# Patient Record
Sex: Male | Born: 1985
Health system: Southern US, Community
[De-identification: ages and names within clinical notes are randomized; demographics above are authoritative.]

## PROBLEM LIST (undated history)

## (undated) DIAGNOSIS — E119 Type 2 diabetes mellitus without complications: Secondary | ICD-10-CM

## (undated) DIAGNOSIS — I10 Essential (primary) hypertension: Secondary | ICD-10-CM

## (undated) DIAGNOSIS — F419 Anxiety disorder, unspecified: Secondary | ICD-10-CM

## (undated) DIAGNOSIS — E669 Obesity, unspecified: Secondary | ICD-10-CM

## (undated) DIAGNOSIS — K219 Gastro-esophageal reflux disease without esophagitis: Secondary | ICD-10-CM

## (undated) DIAGNOSIS — G4730 Sleep apnea, unspecified: Secondary | ICD-10-CM

## (undated) HISTORY — DX: Obesity, unspecified: E66.9

## (undated) HISTORY — DX: Gastro-esophageal reflux disease without esophagitis: K21.9

## (undated) HISTORY — PX: OTHER SURGICAL HISTORY: SHX169

## (undated) HISTORY — DX: Anxiety disorder, unspecified: F41.9

## (undated) HISTORY — DX: Sleep apnea, unspecified: G47.30

## (undated) HISTORY — DX: Type 2 diabetes mellitus without complications: E11.9

## (undated) HISTORY — DX: Essential (primary) hypertension: I10

---

## 2018-06-21 ENCOUNTER — Ambulatory Visit: Payer: BLUE CROSS/BLUE SHIELD | Admitting: Family Medicine

## 2018-06-28 ENCOUNTER — Telehealth: Payer: Self-pay

## 2018-06-28 NOTE — Telephone Encounter (Signed)
Questions for Screening COVID-19  Symptom onset:n/a  Travel or Contacts: no  During this illness, did/does the patient experience any of the following symptoms? Fever >100.4F []  Yes [x]  No []  Unknown Subjective fever (felt feverish) []  Yes [x]  No []  Unknown Chills []  Yes [x]  No []  Unknown Muscle aches (myalgia) []  Yes [x]  No []  Unknown Runny nose (rhinorrhea) []  Yes [x]  No []  Unknown Sore throat []  Yes [x]  No []  Unknown Cough (new onset or worsening of chronic cough) []  Yes [x]  No []  Unknown Shortness of breath (dyspnea) []  Yes [x]  No []  Unknown Nausea or vomiting []  Yes [x]  No []  Unknown Headache []  Yes [x]  No []  Unknown Abdominal pain  []  Yes [x]  No []  Unknown Diarrhea (?3 loose/looser than normal stools/24hr period) []  Yes [x]  No []  Unknown Other, specify:  Patient risk factors: Smoker? []  Current []  Former []  Never If male, currently pregnant? []  Yes []  No  There are no active problems to display for this patient.   Plan:  []  High risk for COVID-19 with red flags go to ED (with CP, SOB, weak/lightheaded, or fever > 101.5). Call ahead.  []  High risk for COVID-19 but stable. Inform provider and coordinate time for WEBEX visit.   []  No red flags but URI signs or symptoms okay for WEBEX visit.  

## 2018-06-29 ENCOUNTER — Encounter: Payer: Self-pay | Admitting: Family Medicine

## 2018-06-29 ENCOUNTER — Other Ambulatory Visit: Payer: Self-pay

## 2018-06-29 ENCOUNTER — Ambulatory Visit (INDEPENDENT_AMBULATORY_CARE_PROVIDER_SITE_OTHER): Payer: BC Managed Care – PPO | Admitting: Family Medicine

## 2018-06-29 VITALS — BP 140/92 | HR 65 | Temp 97.6°F | Ht 71.0 in | Wt 375.8 lb

## 2018-06-29 DIAGNOSIS — Z7689 Persons encountering health services in other specified circumstances: Secondary | ICD-10-CM | POA: Diagnosis not present

## 2018-06-29 DIAGNOSIS — Z6841 Body Mass Index (BMI) 40.0 and over, adult: Secondary | ICD-10-CM

## 2018-06-29 DIAGNOSIS — I10 Essential (primary) hypertension: Secondary | ICD-10-CM

## 2018-06-29 DIAGNOSIS — Z0000 Encounter for general adult medical examination without abnormal findings: Secondary | ICD-10-CM | POA: Diagnosis not present

## 2018-06-29 DIAGNOSIS — H18603 Keratoconus, unspecified, bilateral: Secondary | ICD-10-CM | POA: Diagnosis not present

## 2018-06-29 LAB — LIPID PANEL
Cholesterol: 133 mg/dL (ref 0–200)
HDL: 27.6 mg/dL — ABNORMAL LOW (ref >39.00)
LDL Cholesterol: 78 mg/dL (ref 0–99)
NonHDL: 105.88
Total CHOL/HDL Ratio: 5
Triglycerides: 140 mg/dL (ref 0.0–149.0)
VLDL: 28 mg/dL (ref 0.0–40.0)

## 2018-06-29 LAB — BASIC METABOLIC PANEL
BUN: 11 mg/dL (ref 6–23)
CO2: 29 mEq/L (ref 19–32)
Calcium: 9.1 mg/dL (ref 8.4–10.5)
Chloride: 102 mEq/L (ref 96–112)
Creatinine, Ser: 1.03 mg/dL (ref 0.40–1.50)
GFR: 101.28 mL/min (ref >60.00)
Glucose, Bld: 118 mg/dL — ABNORMAL HIGH (ref 70–99)
Potassium: 4 mEq/L (ref 3.5–5.1)
Sodium: 138 mEq/L (ref 135–145)

## 2018-06-29 LAB — ALT: ALT: 66 U/L — ABNORMAL HIGH (ref 0–53)

## 2018-06-29 LAB — AST: AST: 40 U/L — ABNORMAL HIGH (ref 0–37)

## 2018-06-29 MED ORDER — CARVEDILOL 12.5 MG PO TABS: 12.5000 mg | ORAL_TABLET | Freq: Two times a day (BID) | ORAL | 3 refills | Status: DC

## 2018-06-29 NOTE — Progress Notes (Signed)
Raymond Costa is a 33 y.o. male  Chief Complaint  Patient presents with  . Establish Care    CPE/ Fasting/ Tdap Tdap 2016    HPI: Raymond Costa is a 33 y.o. male here to establish care with our office. He is here for CPE, fasting labs. He is married, no children.  Previous PCP Dr. Morton Stall in [place]. Last OV 1 year ago. Last Tadp 2016.  Pt has a h/o HTN and has been on coreg 12.5mg  BID x 2 years. He notes being on lisinopril prior to coreg but pt asked to switch d/t "side effects he heard about"  Dental exam: 1 year ago, due Vision: UTD - last appt earlier today  Diet/Exercise: pt has started doing intermittent fasting (12am-4pm); pt exercises 30 min daily  Med refills needed today: none  Past Medical History:  Diagnosis Date  . Hypertension     History reviewed. No pertinent surgical history.  Social History   Socioeconomic History  . Marital status: Married    Spouse name: Not on file  . Number of children: Not on file  . Years of education: Not on file  . Highest education level: Not on file  Occupational History  . Not on file  Social Needs  . Financial resource strain: Not on file  . Food insecurity    Worry: Not on file    Inability: Not on file  . Transportation needs    Medical: Not on file    Non-medical: Not on file  Tobacco Use  . Smoking status: Never Smoker  . Smokeless tobacco: Never Used  Substance and Sexual Activity  . Alcohol use: Never    Frequency: Never  . Drug use: Never  . Sexual activity: Not on file  Lifestyle  . Physical activity    Days per week: Not on file    Minutes per session: Not on file  . Stress: Not on file  Relationships  . Social Herbalist on phone: Not on file    Gets together: Not on file    Attends religious service: Not on file    Active member of club or organization: Not on file    Attends meetings of clubs or organizations: Not on file    Relationship status: Not on file  . Intimate partner  violence    Fear of current or ex partner: Not on file    Emotionally abused: Not on file    Physically abused: Not on file    Forced sexual activity: Not on file  Other Topics Concern  . Not on file  Social History Narrative  . Not on file    Family History  Problem Relation Age of Onset  . Hypertension Mother   . Diabetes Mother   . Cancer Father        leukemia      There is no immunization history on file for this patient.  Outpatient Encounter Medications as of 06/29/2018  Medication Sig  . carvedilol (COREG) 12.5 MG tablet Take 12.5 mg by mouth 2 (two) times daily with a meal.   No facility-administered encounter medications on file as of 06/29/2018.      ROS: Gen: no fever, chills  Skin: no rash, itching ENT: no ear pain, ear drainage, nasal congestion, rhinorrhea, sinus pressure, sore throat Eyes: no blurry vision, double vision Resp: no cough, wheeze,SOB CV: no CP, palpitations, LE edema,  GI: occasional heartburn (mostly depending on type of food he  eats), n/v/d/c, abd pain GU: no dysuria, urgency, frequency, hematuria; no testicular swelling or masses MSK: no joint pain, myalgias, back pain Neuro: no dizziness, headache, weakness, vertigo Psych: no depression, anxiety, insomnia   No Known Allergies  BP (!) 140/92   Pulse 65   Temp 97.6 F (36.4 C) (Oral)   Ht 5\' 11"  (1.803 m)   Wt (!) 375 lb 12.8 oz (170.5 kg)   SpO2 97%   BMI 52.41 kg/m   BP Readings from Last 3 Encounters:  06/29/18 (!) 140/92     Physical Exam  Constitutional: He is oriented to person, place, and time. He appears well-developed and well-nourished. No distress.  Morbidly obese  HENT:  Head: Normocephalic and atraumatic.  Right Ear: Tympanic membrane and ear canal normal.  Left Ear: Tympanic membrane and ear canal normal.  Nose: Nose normal.  Mouth/Throat: Oropharynx is clear and moist and mucous membranes are normal.  Eyes: Pupils are equal, round, and reactive to light.  Conjunctivae are normal.  Neck: Normal range of motion. No thyromegaly present.  Cardiovascular: Normal rate and regular rhythm. Exam reveals distant heart sounds.  No murmur heard. Pulmonary/Chest: Effort normal and breath sounds normal. No respiratory distress.  Abdominal: Soft. Bowel sounds are normal. He exhibits no distension. There is no abdominal tenderness.  Musculoskeletal:        General: No edema.  Lymphadenopathy:    He has no cervical adenopathy.  Neurological: He is alert and oriented to person, place, and time. He exhibits normal muscle tone. Coordination normal.  Skin: Skin is warm and dry.  Psychiatric: He has a normal mood and affect. His behavior is normal.     A/P:  1. Annual physical exam - immunizations UTD - due for dental exam, UTD on vision exam - diet and exercise - see below #3 - ALT - AST - Basic metabolic panel - Lipid panel - next CPE in 1 year  2. Encounter to establish care with new doctor  3. BMI 50.0-59.9, adult (HCC) - pt has recently started intermittent fasting (12am-4pm) and notes that his clothes fit better/feel loose but he hasn't seen a decrease in his weight yet - recommended pt continue with IF and start regular walking/exercise routine with goal of 30-3745min 4-5x/wk  4. Essential hypertension - BP slightly elevated today but pt states BP has been <140/<90 almost consistently since starting coreg - recommend DASH diet Refill: - carvedilol (COREG) 12.5 MG tablet; Take 1 tablet (12.5 mg total) by mouth 2 (two) times daily with a meal.  Dispense: 180 tablet; Refill: 3

## 2018-06-30 ENCOUNTER — Other Ambulatory Visit: Payer: Self-pay | Admitting: Family Medicine

## 2018-06-30 ENCOUNTER — Encounter: Payer: Self-pay | Admitting: Family Medicine

## 2018-06-30 DIAGNOSIS — R7301 Impaired fasting glucose: Secondary | ICD-10-CM

## 2018-07-26 ENCOUNTER — Other Ambulatory Visit: Payer: Self-pay | Admitting: *Deleted

## 2018-07-26 DIAGNOSIS — R6889 Other general symptoms and signs: Secondary | ICD-10-CM | POA: Diagnosis not present

## 2018-07-26 DIAGNOSIS — Z20822 Contact with and (suspected) exposure to covid-19: Secondary | ICD-10-CM

## 2018-07-30 LAB — NOVEL CORONAVIRUS, NAA: SARS-CoV-2, NAA: NOT DETECTED

## 2018-08-07 ENCOUNTER — Telehealth: Payer: Self-pay

## 2018-08-07 NOTE — Telephone Encounter (Signed)
Copied from [place] [id number]. Topic: General - Other >> Aug 07, 2018 11:19 AM Yvette Rack wrote: Reason for CRM: Pt stated he needs a copy of his Covid-19 test results to return back to work. Informed pt that he should be able to see the results on his [employer] account. Pt asked if he could have a copy of the results to provide to his employer. Pt requests call back once copy of results is available for pick up

## 2018-08-07 NOTE — Telephone Encounter (Signed)
Letter printed, placed up front for the pt to pick up before 4 pm. Pt is aware.

## 2018-08-30 ENCOUNTER — Emergency Department (HOSPITAL_COMMUNITY)
Admission: EM | Admit: 2018-08-30 | Discharge: 2018-08-30 | Disposition: A | Discharge status: Home / Self Care | Payer: BC Managed Care – PPO | Attending: Emergency Medicine | Admitting: Emergency Medicine

## 2018-08-30 ENCOUNTER — Emergency Department (HOSPITAL_COMMUNITY): Payer: BC Managed Care – PPO

## 2018-08-30 ENCOUNTER — Other Ambulatory Visit: Payer: Self-pay

## 2018-08-30 ENCOUNTER — Encounter (HOSPITAL_COMMUNITY): Payer: Self-pay

## 2018-08-30 DIAGNOSIS — R0789 Other chest pain: Secondary | ICD-10-CM | POA: Diagnosis not present

## 2018-08-30 DIAGNOSIS — Z79899 Other long term (current) drug therapy: Secondary | ICD-10-CM | POA: Insufficient documentation

## 2018-08-30 DIAGNOSIS — R002 Palpitations: Secondary | ICD-10-CM

## 2018-08-30 LAB — BASIC METABOLIC PANEL
Anion gap: 10 (ref 5–15)
BUN: 10 mg/dL (ref 6–20)
CO2: 25 mmol/L (ref 22–32)
Calcium: 9.1 mg/dL (ref 8.9–10.3)
Chloride: 104 mmol/L (ref 98–111)
Creatinine, Ser: 1.1 mg/dL (ref 0.61–1.24)
GFR calc Af Amer: >60 mL/min (ref >60)
GFR calc non Af Amer: >60 mL/min (ref >60)
Glucose, Bld: 101 mg/dL — ABNORMAL HIGH (ref 70–99)
Potassium: 4 mmol/L (ref 3.5–5.1)
Sodium: 139 mmol/L (ref 135–145)

## 2018-08-30 LAB — CBC
HCT: 44.7 % (ref 39.0–52.0)
Hemoglobin: 16.3 g/dL (ref 13.0–17.0)
MCH: 29.4 pg (ref 26.0–34.0)
MCHC: 36.5 g/dL — ABNORMAL HIGH (ref 30.0–36.0)
MCV: 80.7 fL (ref 80.0–100.0)
Platelets: 317 10*3/uL (ref 150–400)
RBC: 5.54 MIL/uL (ref 4.22–5.81)
RDW: 13.5 % (ref 11.5–15.5)
WBC: 6 10*3/uL (ref 4.0–10.5)
nRBC: 0 % (ref 0.0–0.2)

## 2018-08-30 LAB — TSH: TSH: 2.109 u[IU]/mL (ref 0.350–4.500)

## 2018-08-30 LAB — D-DIMER, QUANTITATIVE: D-Dimer, Quant: <0.27 ug/mL-FEU (ref 0.00–0.50)

## 2018-08-30 LAB — TROPONIN I (HIGH SENSITIVITY)
Troponin I (High Sensitivity): 5 ng/L (ref <18)
Troponin I (High Sensitivity): 4 ng/L (ref <18)

## 2018-08-30 MED ORDER — SODIUM CHLORIDE 0.9% FLUSH: 3.0000 mL | Freq: Once | INTRAVENOUS | Status: DC

## 2018-08-30 MED ORDER — LORAZEPAM 2 MG/ML IJ SOLN
0.5000 mg | Freq: Once | INTRAMUSCULAR | Status: AC
  Administered 2018-08-30: 16:00:00 0.5 mg via INTRAVENOUS
  Filled 2018-08-30: qty 1

## 2018-08-30 MED ORDER — LABETALOL HCL 5 MG/ML IV SOLN
10.0000 mg | Freq: Once | INTRAVENOUS | Status: AC
  Administered 2018-08-30: 16:00:00 10 mg via INTRAVENOUS
  Filled 2018-08-30: qty 4

## 2018-08-30 NOTE — ED Triage Notes (Signed)
Patient reports that he was heading to work and felt a flutter in his right upper chest and thought it could be reflux,but once at work he continues to have fluttering and felt like it had increased. Patient states he did "burp" some,but that did not relieve the symptoms. Patient states when the symptoms started he "felt winded"

## 2018-08-30 NOTE — Discharge Instructions (Addendum)
Avoid energy drinks and caffeine.  Follow-up closely with your primary physician regarding blood pressure control.  Return immediately for any worsening of your symptoms or concerns.

## 2018-08-30 NOTE — ED Provider Notes (Signed)
[facility]-EMERGENCY DEPT Provider Note   CSN: [id number] Arrival date & time: 08/30/18  1309    History   Chief Complaint Chief Complaint  Patient presents with  . fluttering in chest  . Hypertension    HPI Raymond Costa is a 33 y.o. male.     HPI Patient with history of hypertension for which he takes carvedilol twice daily.  States he has been compliant with the medication.  While at work this morning he developed palpitations.  He became increasingly anxious and had left-sided chest pain which is now resolved.  Patient still feels anxious.  He denies any new lower extremity swelling or pain.  No recent extended travel or immobilization.  Patient does admit to drinking caffeinated drinks frequently. Past Medical History:  Diagnosis Date  . Hypertension     There are no active problems to display for this patient.   Past Surgical History:  Procedure Laterality Date  . arm fracture surgery          Home Medications    Prior to Admission medications   Medication Sig Start Date End Date Taking? Authorizing Provider  carvedilol (COREG) 12.5 MG tablet Take 1 tablet (12.5 mg total) by mouth 2 (two) times daily with a meal. 06/29/18  Yes Cirigliano, Jearld LeschMary K, DO    Family History Family History  Problem Relation Age of Onset  . Hypertension Mother   . Diabetes Mother   . Cancer Father        leukemia    Social History Social History   Tobacco Use  . Smoking status: Never Smoker  . Smokeless tobacco: Never Used  Substance Use Topics  . Alcohol use: Never    Frequency: Never  . Drug use: Never     Allergies   Shellfish allergy   Review of Systems Review of Systems  Constitutional: Negative for chills and fever.  HENT: Negative for sore throat and trouble swallowing.   Eyes: Negative for visual disturbance.  Respiratory: Positive for shortness of breath. Negative for cough.   Cardiovascular: Positive for chest pain and  palpitations. Negative for leg swelling.  Gastrointestinal: Negative for abdominal pain, constipation, diarrhea, nausea and vomiting.  Genitourinary: Negative for dysuria, flank pain and frequency.  Musculoskeletal: Negative for back pain, joint swelling, myalgias and neck pain.  Skin: Negative for rash and wound.  Neurological: Negative for dizziness, syncope, weakness, light-headedness, numbness and headaches.  Psychiatric/Behavioral: The patient is nervous/anxious.   All other systems reviewed and are negative.    Physical Exam Updated Vital Signs BP (!) 185/117   Pulse 100   Temp 99.2 F (37.3 C) (Oral)   Resp 16   Ht 6' (1.829 m)   Wt (!) 166.9 kg   SpO2 97%   BMI 49.91 kg/m   Physical Exam Vitals signs and nursing note reviewed.  Constitutional:      Appearance: He is well-developed. He is obese.     Comments: Anxious appearing  HENT:     Head: Normocephalic and atraumatic.     Nose: Nose normal.     Mouth/Throat:     Mouth: Mucous membranes are moist.  Eyes:     Extraocular Movements: Extraocular movements intact.     Pupils: Pupils are equal, round, and reactive to light.  Neck:     Musculoskeletal: Normal range of motion and neck supple. No neck rigidity or muscular tenderness.     Comments: No appreciated thyroid masses. Cardiovascular:     Rate  and Rhythm: Regular rhythm. Tachycardia present.     Heart sounds: No murmur. No friction rub. No gallop.   Pulmonary:     Effort: Pulmonary effort is normal. No respiratory distress.     Breath sounds: Normal breath sounds. No stridor. No wheezing, rhonchi or rales.  Chest:     Chest wall: No tenderness.  Abdominal:     General: Bowel sounds are normal. There is no distension.     Palpations: Abdomen is soft. There is no mass.     Tenderness: There is no abdominal tenderness. There is no right CVA tenderness, left CVA tenderness, guarding or rebound.     Hernia: No hernia is present.  Musculoskeletal: Normal  range of motion.        General: No swelling, tenderness, deformity or signs of injury.     Right lower leg: No edema.     Left lower leg: No edema.  Lymphadenopathy:     Cervical: No cervical adenopathy.  Skin:    General: Skin is warm and dry.     Capillary Refill: Capillary refill takes less than 2 seconds.     Findings: No erythema or rash.  Neurological:     General: No focal deficit present.     Mental Status: He is alert and oriented to person, place, and time.     Comments: 5/5 motor in all extremities.  Sensation intact.  No tremor present.  Psychiatric:        Behavior: Behavior normal.      ED Treatments / Results  Labs (all labs ordered are listed, but only abnormal results are displayed) Labs Reviewed  BASIC METABOLIC PANEL - Abnormal; Notable for the following components:      Result Value   Glucose, Bld 101 (*)    All other components within normal limits  CBC - Abnormal; Notable for the following components:   MCHC 36.5 (*)    All other components within normal limits  TSH  D-DIMER, QUANTITATIVE (NOT AT [facility])  TROPONIN I (HIGH SENSITIVITY)  TROPONIN I (HIGH SENSITIVITY)  TROPONIN I (HIGH SENSITIVITY)    EKG EKG Interpretation  Date/Time:  Wednesday August 30 2018 13:22:34 EDT Ventricular Rate:  119 PR Interval:    QRS Duration: 82 QT Interval:  332 QTC Calculation: 468 R Axis:   -14 Text Interpretation:  Sinus tachycardia Confirmed by Benjiman CorePickering, Nathan [phone]([id number]) on 08/30/2018 1:48:18 PM   Radiology Dg Chest 2 View  Result Date: 08/30/2018 CLINICAL DATA:  Heart fluttering this morning; no chest hx EXAM: CHEST - 2 VIEW COMPARISON:  None. FINDINGS: The heart size and mediastinal contours are within normal limits. The lungs are clear. No pneumothorax or pleural effusion. The visualized skeletal structures are unremarkable. IMPRESSION: No acute cardiopulmonary process Electronically Signed   By: Emmaline KluverNancy  Ballantyne M.D.   On: 08/30/2018 13:50     Procedures Procedures (including critical care time)  Medications Ordered in ED Medications  LORazepam (ATIVAN) injection 0.5 mg (0.5 mg Intravenous Given 08/30/18 1536)  labetalol (NORMODYNE) injection 10 mg (10 mg Intravenous Given 08/30/18 1626)     Initial Impression / Assessment and Plan / ED Course  I have reviewed the triage vital signs and the nursing notes.  Pertinent labs & imaging results that were available during my care of the patient were reviewed by me and considered in my medical decision making (see chart for details).       Chest pain and palpitations have completely resolved.  Troponin x2 is  normal.  Normal d-dimer.  Patient did receive dose of labetalol in the emergency department for his hypertension with improvement.  He will follow-up closely with his primary physician regarding possible adjustment to his blood pressure medication.  Anxiety is likely playing a role in his symptoms as well.  Strict return precautions have been given.   Final Clinical Impressions(s) / ED Diagnoses   Final diagnoses:  Palpitations  Atypical chest pain    ED Discharge Orders    None       Julianne Rice, MD 08/30/18 1918

## 2018-08-31 ENCOUNTER — Ambulatory Visit (INDEPENDENT_AMBULATORY_CARE_PROVIDER_SITE_OTHER): Payer: BC Managed Care – PPO | Admitting: Family Medicine

## 2018-08-31 ENCOUNTER — Encounter: Payer: Self-pay | Admitting: Gastroenterology

## 2018-08-31 ENCOUNTER — Encounter: Payer: Self-pay | Admitting: Family Medicine

## 2018-08-31 ENCOUNTER — Other Ambulatory Visit: Payer: Self-pay

## 2018-08-31 VITALS — BP 148/92 | HR 79 | Temp 98.1°F | Ht 71.0 in | Wt 368.6 lb

## 2018-08-31 DIAGNOSIS — Z6841 Body Mass Index (BMI) 40.0 and over, adult: Secondary | ICD-10-CM

## 2018-08-31 DIAGNOSIS — K219 Gastro-esophageal reflux disease without esophagitis: Secondary | ICD-10-CM | POA: Diagnosis not present

## 2018-08-31 DIAGNOSIS — I10 Essential (primary) hypertension: Secondary | ICD-10-CM

## 2018-08-31 MED ORDER — LOSARTAN POTASSIUM-HCTZ 50-12.5 MG PO TABS: 1.0000 | ORAL_TABLET | Freq: Every day | ORAL | 3 refills | Status: DC

## 2018-08-31 NOTE — Progress Notes (Addendum)
Raymond Costa is a 33 y.o. male  Chief Complaint  Patient presents with  . Establish Care    ED follow up    HPI: Raymond Costa is a 33 y.o. male here for f/u after being seen in ER at [facility] yesterday for palpitations and chest pain, both of which have resolved at this time. In ER, pt has neg troponin x 2, neg d-dimer, normal TSH, CBC, BMP. EKG showed sinus tach but otherwise normal.  BP was elevated and improved with labetalol 10mg  IV x 1 dose. Pt was also given 0.5mg  ativan IV x 1 due to increased anxiety.   Today, pt is asymptomatic. He is worried about BP.   Pt has HTN and takes coreg 12.5mg  BID. He was on HCTZ as well in the past. It ran out and pt did not refill. He was concerned about possible side effects so PCP changed him to coreg.     Pt also notes GERD x years. Some foods are known triggers but still gets symptoms when he avoids these. He has tried nexium, maalox with minimal relief. He belches frequently. He sometimes regurgitates food. He would like to see GI.   Past Medical History:  Diagnosis Date  . Hypertension     Past Surgical History:  Procedure Laterality Date  . arm fracture surgery      Social History   Socioeconomic History  . Marital status: Married    Spouse name: Not on file  . Number of children: Not on file  . Years of education: Not on file  . Highest education level: Not on file  Occupational History  . Not on file  Social Needs  . Financial resource strain: Not on file  . Food insecurity    Worry: Not on file    Inability: Not on file  . Transportation needs    Medical: Not on file    Non-medical: Not on file  Tobacco Use  . Smoking status: Never Smoker  . Smokeless tobacco: Never Used  Substance and Sexual Activity  . Alcohol use: Never    Frequency: Never  . Drug use: Never  . Sexual activity: Not on file  Lifestyle  . Physical activity    Days per week: Not on file    Minutes per session: Not on file  .  Stress: Not on file  Relationships  . Social Herbalist on phone: Not on file    Gets together: Not on file    Attends religious service: Not on file    Active member of club or organization: Not on file    Attends meetings of clubs or organizations: Not on file    Relationship status: Not on file  . Intimate partner violence    Fear of current or ex partner: Not on file    Emotionally abused: Not on file    Physically abused: Not on file    Forced sexual activity: Not on file  Other Topics Concern  . Not on file  Social History Narrative  . Not on file    Family History  Problem Relation Age of Onset  . Hypertension Mother   . Diabetes Mother   . Cancer Father        leukemia      There is no immunization history on file for this patient.  Outpatient Encounter Medications as of 08/31/2018  Medication Sig Note  . carvedilol (COREG) 12.5 MG tablet Take 1 tablet (12.5  mg total) by mouth 2 (two) times daily with a meal. 08/30/2018: Pt takes at 0900 and at 21:00.  . losartan-hydrochlorothiazide (HYZAAR) 50-12.5 MG tablet Take 1 tablet by mouth daily.    No facility-administered encounter medications on file as of 08/31/2018.      ROS: Pertinent positives and negatives noted in HPI. Remainder of ROS non-contributory    Allergies  Allergen Reactions  . Shellfish Allergy Hives    BP (!) 148/92   Pulse 79   Temp 98.1 F (36.7 C) (Oral)   Ht 5\' 11"  (1.803 m)   Wt (!) 368 lb 9.6 oz (167.2 kg)   SpO2 97%   BMI 51.41 kg/m   BP Readings from Last 3 Encounters:  08/31/18 (!) 148/92  08/30/18 (!) 185/117  06/29/18 (!) 140/92   Wt Readings from Last 3 Encounters:  08/31/18 (!) 368 lb 9.6 oz (167.2 kg)  08/30/18 (!) 368 lb (166.9 kg)  06/29/18 (!) 375 lb 12.8 oz (170.5 kg)    Physical Exam  Constitutional: He is oriented to person, place, and time. He appears well-developed and well-nourished. No distress.  Neck: No thyromegaly present.  Cardiovascular:  Normal rate, regular rhythm, S1 normal, S2 normal and normal pulses. Exam reveals distant heart sounds.  Pulmonary/Chest: Effort normal. He has no decreased breath sounds. He has no wheezes. He has no rhonchi.  Musculoskeletal:        General: No edema.  Neurological: He is alert and oriented to person, place, and time.  Skin: Skin is warm and dry.     A/P:  1. Essential hypertension - BP above goal - stop coreg  Rx: - losartan-hydrochlorothiazide (HYZAAR) 50-12.5 MG tablet; Take 1 tablet by mouth daily.  Dispense: 90 tablet; Refill: 3 - pt to check to BP at home 4x/wk x 2 wks then f/u in 2 wks - cont with low salt diet - congratulated pt on weight loss since last OV Discussed plan and reviewed medications with patient, including risks, benefits, and potential side effects. Pt expressed understand. All questions answered.

## 2018-08-31 NOTE — Patient Instructions (Addendum)
Stop coreg Start losartan/HCTZ 1 tab daily in AM Check BP about 4x/wk at least 2hrs after taking med and keep a log of these readings Follow-up in 2 weeks    Managing Your Hypertension Hypertension is commonly called high blood pressure. This is when the force of your blood pressing against the walls of your arteries is too strong. Arteries are blood vessels that carry blood from your heart throughout your body. Hypertension forces the heart to work harder to pump blood, and may cause the arteries to become narrow or stiff. Having untreated or uncontrolled hypertension can cause heart attack, stroke, kidney disease, and other problems. What are blood pressure readings? A blood pressure reading consists of a higher number over a lower number. Ideally, your blood pressure should be below 120/80. The first ("top") number is called the systolic pressure. It is a measure of the pressure in your arteries as your heart beats. The second ("bottom") number is called the diastolic pressure. It is a measure of the pressure in your arteries as the heart relaxes. What does my blood pressure reading mean? Blood pressure is classified into four stages. Based on your blood pressure reading, your health care provider may use the following stages to determine what type of treatment you need, if any. Systolic pressure and diastolic pressure are measured in a unit called mm Hg. Normal  Systolic pressure: below 120.  Diastolic pressure: below 80. Elevated  Systolic pressure: 120-129.  Diastolic pressure: below 80. Hypertension stage 1  Systolic pressure: 130-139.  Diastolic pressure: 80-89. Hypertension stage 2  Systolic pressure: 140 or above.  Diastolic pressure: 90 or above. What health risks are associated with hypertension? Managing your hypertension is an important responsibility. Uncontrolled hypertension can lead to:  A heart attack.  A stroke.  A weakened blood vessel (aneurysm).  Heart  failure.  Kidney damage.  Eye damage.  Metabolic syndrome.  Memory and concentration problems. What changes can I make to manage my hypertension? Hypertension can be managed by making lifestyle changes and possibly by taking medicines. Your health care provider will help you make a plan to bring your blood pressure within a normal range. Eating and drinking   Eat a diet that is high in fiber and potassium, and low in salt (sodium), added sugar, and fat. An example eating plan is called the DASH (Dietary Approaches to Stop Hypertension) diet. To eat this way: ? Eat plenty of fresh fruits and vegetables. Try to fill half of your plate at each meal with fruits and vegetables. ? Eat whole grains, such as whole wheat pasta, brown rice, or whole grain bread. Fill about one quarter of your plate with whole grains. ? Eat low-fat diary products. ? Avoid fatty cuts of meat, processed or cured meats, and poultry with skin. Fill about one quarter of your plate with lean proteins such as fish, chicken without skin, beans, eggs, and tofu. ? Avoid premade and processed foods. These tend to be higher in sodium, added sugar, and fat.  Reduce your daily sodium intake. Most people with hypertension should eat less than 1,500 mg of sodium a day.  Limit alcohol intake to no more than 1 drink a day for nonpregnant women and 2 drinks a day for men. One drink equals 12 oz of beer, 5 oz of wine, or 1 oz of hard liquor. Lifestyle  Work with your health care provider to maintain a healthy body weight, or to lose weight. Ask what an ideal weight is for  you.  Get at least 30 minutes of exercise that causes your heart to beat faster (aerobic exercise) most days of the week. Activities may include walking, swimming, or biking.  Include exercise to strengthen your muscles (resistance exercise), such as weight lifting, as part of your weekly exercise routine. Try to do these types of exercises for 30 minutes at least  3 days a week.  Do not use any products that contain nicotine or tobacco, such as cigarettes and e-cigarettes. If you need help quitting, ask your health care provider.  Control any long-term (chronic) conditions you have, such as high cholesterol or diabetes. Monitoring  Monitor your blood pressure at home as told by your health care provider. Your personal target blood pressure may vary depending on your medical conditions, your age, and other factors.  Have your blood pressure checked regularly, as often as told by your health care provider. Working with your health care provider  Review all the medicines you take with your health care provider because there may be side effects or interactions.  Talk with your health care provider about your diet, exercise habits, and other lifestyle factors that may be contributing to hypertension.  Visit your health care provider regularly. Your health care provider can help you create and adjust your plan for managing hypertension. Will I need medicine to control my blood pressure? Your health care provider may prescribe medicine if lifestyle changes are not enough to get your blood pressure under control, and if:  Your systolic blood pressure is 130 or higher.  Your diastolic blood pressure is 80 or higher. Take medicines only as told by your health care provider. Follow the directions carefully. Blood pressure medicines must be taken as prescribed. The medicine does not work as well when you skip doses. Skipping doses also puts you at risk for problems. Contact a health care provider if:  You think you are having a reaction to medicines you have taken.  You have repeated (recurrent) headaches.  You feel dizzy.  You have swelling in your ankles.  You have trouble with your vision. Get help right away if:  You develop a severe headache or confusion.  You have unusual weakness or numbness, or you feel faint.  You have severe pain in your  chest or abdomen.  You vomit repeatedly.  You have trouble breathing. Summary  Hypertension is when the force of blood pumping through your arteries is too strong. If this condition is not controlled, it may put you at risk for serious complications.  Your personal target blood pressure may vary depending on your medical conditions, your age, and other factors. For most people, a normal blood pressure is less than 120/80.  Hypertension is managed by lifestyle changes, medicines, or both. Lifestyle changes include weight loss, eating a healthy, low-sodium diet, exercising more, and limiting alcohol. This information is not intended to replace advice given to you by your health care provider. Make sure you discuss any questions you have with your health care provider. Document Released: 09/22/2011 Document Revised: 04/21/2018 Document Reviewed: 11/26/2015 Elsevier Patient Education  2020 [employer].

## 2018-08-31 NOTE — Addendum Note (Signed)
Addended by: Ronnald Nian on: 08/31/2018 02:55 PM   Modules accepted: Orders

## 2018-09-11 ENCOUNTER — Ambulatory Visit (INDEPENDENT_AMBULATORY_CARE_PROVIDER_SITE_OTHER): Payer: BC Managed Care – PPO | Admitting: Gastroenterology

## 2018-09-11 ENCOUNTER — Other Ambulatory Visit: Payer: Self-pay

## 2018-09-11 ENCOUNTER — Encounter: Payer: Self-pay | Admitting: Gastroenterology

## 2018-09-11 VITALS — BP 134/94 | HR 91 | Temp 97.6°F | Ht 71.0 in | Wt 367.2 lb

## 2018-09-11 DIAGNOSIS — R1310 Dysphagia, unspecified: Secondary | ICD-10-CM | POA: Diagnosis not present

## 2018-09-11 DIAGNOSIS — Z6841 Body Mass Index (BMI) 40.0 and over, adult: Secondary | ICD-10-CM | POA: Diagnosis not present

## 2018-09-11 DIAGNOSIS — K219 Gastro-esophageal reflux disease without esophagitis: Secondary | ICD-10-CM | POA: Diagnosis not present

## 2018-09-11 DIAGNOSIS — R1319 Other dysphagia: Secondary | ICD-10-CM

## 2018-09-11 MED ORDER — PANTOPRAZOLE SODIUM 40 MG PO TBEC: 40.0000 mg | DELAYED_RELEASE_TABLET | Freq: Two times a day (BID) | ORAL | 0 refills | Status: DC

## 2018-09-11 NOTE — Patient Instructions (Addendum)
If you are age 32 or older, your body mass index should be between 23-30. Your Body mass index is 51.22 kg/m. If this is out of the aforementioned range listed, please consider follow up with your Primary Care Provider.  If you are age 15 or younger, your body mass index should be between 19-25. Your Body mass index is 51.22 kg/m. If this is out of the aformentioned range listed, please consider follow up with your Primary Care Provider.   It has been recommended to you by your physician that you have a(n) EGD completed. We did not schedule the procedure(s) today. You will be contacted by our office to have this scheduled once there is available appointment.   We have sent the following medications to your pharmacy for you to pick up at your convenience: Protonix 40 mg twice daily x 4 weeks and then once daily.   Follow up in 3 months.   It was a pleasure to see you today!  Vito Cirigliano, D.O.

## 2018-09-11 NOTE — Progress Notes (Signed)
Chief Complaint: GERD, dysphagia  Referring Provider:     Ronnald Nian, DO  HPI:    Raymond Costa is a 33 y.o. male with a history of hypertension, anxiety, and obesity (BMI 51), referred to [facility] for evaluation of GERD. Has had sxs for ate least 5 years.  Index symptoms of belch, regurgitation, sourbrash, pyrosis. Has trialed OTC Nexium, Prilosec, but no regular use and incomplete response to therapy. Does have post prandial dyspepsia. Intermittent episodes of solid food dysphagia, pointing to lower sternal border. Worse with steak. No hematochezia or melena. Tries to prop up pillows. Sxs will worsen his anxiety.  Was also recently seen in the ER on 8/19 with palpitations.  Normal work-up to include CBC, BMP, TSH, troponin, d-dimer, CXR.  Has intentionally lost 13 pounds over the last couple weeks with intermittent fasting, increased activity/exercise.  No previous EGD.  No known family history of CRC, GI malignancy, liver disease, pancreatic disease, or IBD.   Past Medical History:  Diagnosis Date  . Anxiety   . GERD (gastroesophageal reflux disease)   . Hypertension   . Obesity   . Sleep apnea    Not on cpap machine      Past Surgical History:  Procedure Laterality Date  . arm fracture surgery Left    33 years old    Family History  Problem Relation Age of Onset  . Hypertension Mother   . Diabetes Mother   . Cancer Father        leukemia  . Prostate cancer Maternal Grandfather   . Colon cancer Neg Hx   . Esophageal cancer Neg Hx    Social History   Tobacco Use  . Smoking status: Never Smoker  . Smokeless tobacco: Never Used  Substance Use Topics  . Alcohol use: Yes    Frequency: Never    Comment: ocassionally  . Drug use: Never   Current Outpatient Medications  Medication Sig Dispense Refill  . losartan-hydrochlorothiazide (HYZAAR) 50-12.5 MG tablet Take 1 tablet by mouth daily. 90 tablet 3   No current  facility-administered medications for this visit.    Allergies  Allergen Reactions  . Shellfish Allergy Hives     Review of Systems: All systems reviewed and negative except where noted in HPI.     Physical Exam:    Wt Readings from Last 3 Encounters:  09/11/18 (!) 367 lb 4 oz (166.6 kg)  08/31/18 (!) 368 lb 9.6 oz (167.2 kg)  08/30/18 (!) 368 lb (166.9 kg)    BP (!) 134/94   Pulse 91   Temp 97.6 F (36.4 C)   Ht 5\' 11"  (1.803 m)   Wt (!) 367 lb 4 oz (166.6 kg)   BMI 51.22 kg/m  Constitutional:  Pleasant, in no acute distress. Psychiatric: Normal mood and affect. Behavior is normal. EENT: Pupils normal.  Conjunctivae are normal. No scleral icterus. Neck supple. No cervical LAD. Cardiovascular: Normal rate, regular rhythm. No edema Pulmonary/chest: Effort normal and breath sounds normal. No wheezing, rales or rhonchi. Abdominal: Soft, nondistended, nontender. Bowel sounds active throughout. There are no masses palpable. No hepatomegaly. Neurological: Alert and oriented to person place and time. Skin: Skin is warm and dry. No rashes noted.   ASSESSMENT AND PLAN;   Raymond Costa is a 33 y.o. male presenting with:  1) GERD 2) Dysphagia 3) Obesity  -Start Protonix 40 mg p.o. twice daily x4 weeks, and  if controlling reflux symptoms, plan to titrate to lowest effective dose - EGD with possible dilation - Discussed relationship of obesity, particularly central adiposity, and reflux.  He is currently doing intermittent fasting and trying to increase his exercise.  Has already lost 13 pounds intentionally. -Given his obesity and BMI >50, would currently need to schedule [facility] endoscopic procedures [facility].  However, if he is able to continue to lose weight and get BMI <50, can schedule in the [facility] - Recommend sleeping with HOB elevated -Avoid eating within 3 hours of bedtime - Continue current weight loss for overall health benefits along with benefits to reflux   The indications, risks, and benefits of EGD were explained to the patient in detail. Risks include but are not limited to bleeding, perforation, adverse reaction to medications, and cardiopulmonary compromise. Sequelae include but are not limited to the possibility of surgery, hositalization, and mortality. The patient verbalized understanding and wished to proceed. All questions answered, referred to scheduler. Further recommendations pending results of the exam.   Shellia CleverlyVito V Jahmire Ruffins, DO, FACG  09/11/2018, 9:27 AM   Overton Mamirigliano, Mary K, DO

## 2018-09-14 ENCOUNTER — Ambulatory Visit (INDEPENDENT_AMBULATORY_CARE_PROVIDER_SITE_OTHER): Payer: BC Managed Care – PPO | Admitting: Family Medicine

## 2018-09-14 ENCOUNTER — Ambulatory Visit: Payer: BC Managed Care – PPO | Admitting: Family Medicine

## 2018-09-14 ENCOUNTER — Encounter: Payer: Self-pay | Admitting: Family Medicine

## 2018-09-14 VITALS — BP 148/92 | HR 96 | Temp 98.5°F | Ht 71.0 in | Wt 366.0 lb

## 2018-09-14 DIAGNOSIS — I10 Essential (primary) hypertension: Secondary | ICD-10-CM

## 2018-09-14 MED ORDER — LOSARTAN POTASSIUM-HCTZ 50-12.5 MG PO TABS: 2.0000 | ORAL_TABLET | Freq: Every day | ORAL | 3 refills | Status: DC

## 2018-09-14 NOTE — Patient Instructions (Addendum)
Increase BP med to 2 tabs daily Check BP 3x/wk and keep log Follow-up in 2 weeks  DASH Eating Plan DASH stands for "Dietary Approaches to Stop Hypertension." The DASH eating plan is a healthy eating plan that has been shown to reduce high blood pressure (hypertension). It may also reduce your risk for type 2 diabetes, heart disease, and stroke. The DASH eating plan may also help with weight loss. What are tips for following this plan?  General guidelines  Avoid eating more than 2,300 mg (milligrams) of salt (sodium) a day. If you have hypertension, you may need to reduce your sodium intake to 1,500 mg a day.  Limit alcohol intake to no more than 1 drink a day for nonpregnant women and 2 drinks a day for men. One drink equals 12 oz of beer, 5 oz of wine, or 1 oz of hard liquor.  Work with your health care provider to maintain a healthy body weight or to lose weight. Ask what an ideal weight is for you.  Get at least 30 minutes of exercise that causes your heart to beat faster (aerobic exercise) most days of the week. Activities may include walking, swimming, or biking.  Work with your health care provider or diet and nutrition specialist (dietitian) to adjust your eating plan to your individual calorie needs. Reading food labels   Check food labels for the amount of sodium per serving. Choose foods with less than 5 percent of the Daily Value of sodium. Generally, foods with less than 300 mg of sodium per serving fit into this eating plan.  To find whole grains, look for the word "whole" as the first word in the ingredient list. Shopping  Buy products labeled as "low-sodium" or "no salt added."  Buy fresh foods. Avoid canned foods and premade or frozen meals. Cooking  Avoid adding salt when cooking. Use salt-free seasonings or herbs instead of table salt or sea salt. Check with your health care provider or pharmacist before using salt substitutes.  Do not fry foods. Cook foods using  healthy methods such as baking, boiling, grilling, and broiling instead.  Cook with heart-healthy oils, such as olive, canola, soybean, or sunflower oil. Meal planning  Eat a balanced diet that includes: ? 5 or more servings of fruits and vegetables each day. At each meal, try to fill half of your plate with fruits and vegetables. ? Up to 6-8 servings of whole grains each day. ? Less than 6 oz of lean meat, poultry, or fish each day. A 3-oz serving of meat is about the same size as a deck of cards. One egg equals 1 oz. ? 2 servings of low-fat dairy each day. ? A serving of nuts, seeds, or beans 5 times each week. ? Heart-healthy fats. Healthy fats called Omega-3 fatty acids are found in foods such as flaxseeds and coldwater fish, like sardines, salmon, and mackerel.  Limit how much you eat of the following: ? Canned or prepackaged foods. ? Food that is high in trans fat, such as fried foods. ? Food that is high in saturated fat, such as fatty meat. ? Sweets, desserts, sugary drinks, and other foods with added sugar. ? Full-fat dairy products.  Do not salt foods before eating.  Try to eat at least 2 vegetarian meals each week.  Eat more home-cooked food and less restaurant, buffet, and fast food.  When eating at a restaurant, ask that your food be prepared with less salt or no salt, if possible.  What foods are recommended? The items listed may not be a complete list. Talk with your dietitian about what dietary choices are best for you. Grains Whole-grain or whole-wheat bread. Whole-grain or whole-wheat pasta. Brown rice. [place]. Bulgur. Whole-grain and low-sodium cereals. Pita bread. Low-fat, low-sodium crackers. Whole-wheat flour tortillas. Vegetables Fresh or frozen vegetables (raw, steamed, roasted, or grilled). Low-sodium or reduced-sodium tomato and vegetable juice. Low-sodium or reduced-sodium tomato sauce and tomato paste. Low-sodium or reduced-sodium canned  vegetables. Fruits All fresh, dried, or frozen fruit. Canned fruit in natural juice (without added sugar). Meat and other protein foods Skinless chicken or [place]. Ground chicken or [place]. Pork with fat trimmed off. Fish and seafood. Egg whites. Dried beans, peas, or lentils. Unsalted nuts, nut butters, and seeds. Unsalted canned beans. Lean cuts of beef with fat trimmed off. Low-sodium, lean deli meat. Dairy Low-fat (1%) or fat-free (skim) milk. Fat-free, low-fat, or reduced-fat cheeses. Nonfat, low-sodium ricotta or cottage cheese. Low-fat or nonfat yogurt. Low-fat, low-sodium cheese. Fats and oils Soft margarine without trans fats. Vegetable oil. Low-fat, reduced-fat, or light mayonnaise and salad dressings (reduced-sodium). Canola, safflower, olive, soybean, and sunflower oils. Avocado. Seasoning and other foods Herbs. Spices. Seasoning mixes without salt. Unsalted popcorn and pretzels. Fat-free sweets. What foods are not recommended? The items listed may not be a complete list. Talk with your dietitian about what dietary choices are best for you. Grains Baked goods made with fat, such as croissants, muffins, or some breads. Dry pasta or rice meal packs. Vegetables Creamed or fried vegetables. Vegetables in a cheese sauce. Regular canned vegetables (not low-sodium or reduced-sodium). Regular canned tomato sauce and paste (not low-sodium or reduced-sodium). Regular tomato and vegetable juice (not low-sodium or reduced-sodium). Angie Fava. Olives. Fruits Canned fruit in a light or heavy syrup. Fried fruit. Fruit in cream or butter sauce. Meat and other protein foods Fatty cuts of meat. Ribs. Fried meat. Berniece Salines. Sausage. Bologna and other processed lunch meats. Salami. Fatback. Hotdogs. Bratwurst. Salted nuts and seeds. Canned beans with added salt. Canned or smoked fish. Whole eggs or egg yolks. Chicken or [place] with skin. Dairy Whole or 2% milk, cream, and half-and-half. Whole or full-fat  cream cheese. Whole-fat or sweetened yogurt. Full-fat cheese. Nondairy creamers. Whipped toppings. Processed cheese and cheese spreads. Fats and oils Butter. Stick margarine. Lard. Shortening. Ghee. Bacon fat. Tropical oils, such as coconut, palm kernel, or palm oil. Seasoning and other foods Salted popcorn and pretzels. Onion salt, garlic salt, seasoned salt, table salt, and sea salt. Worcestershire sauce. Tartar sauce. Barbecue sauce. Teriyaki sauce. Soy sauce, including reduced-sodium. Steak sauce. Canned and packaged gravies. Fish sauce. Oyster sauce. Cocktail sauce. Horseradish that you find on the shelf. Ketchup. Mustard. Meat flavorings and tenderizers. Bouillon cubes. Hot sauce and Tabasco sauce. Premade or packaged marinades. Premade or packaged taco seasonings. Relishes. Regular salad dressings. Where to find more information:  National Heart, Lung, and [place]: https://wilson-eaton.com/  American Heart Association: www.heart.org Summary  The DASH eating plan is a healthy eating plan that has been shown to reduce high blood pressure (hypertension). It may also reduce your risk for type 2 diabetes, heart disease, and stroke.  With the DASH eating plan, you should limit salt (sodium) intake to 2,300 mg a day. If you have hypertension, you may need to reduce your sodium intake to 1,500 mg a day.  When on the DASH eating plan, aim to eat more fresh fruits and vegetables, whole grains, lean proteins, low-fat dairy, and heart-healthy fats.  Work  with your health care provider or diet and nutrition specialist (dietitian) to adjust your eating plan to your individual calorie needs. This information is not intended to replace advice given to you by your health care provider. Make sure you discuss any questions you have with your health care provider. Document Released: 12/17/2010 Document Revised: 12/10/2016 Document Reviewed: 12/22/2015 Elsevier Patient Education  2020 [employer].   Hypertension, Adult Hypertension is another name for high blood pressure. High blood pressure forces your heart to work harder to pump blood. This can cause problems over time. There are two numbers in a blood pressure reading. There is a top number (systolic) over a bottom number (diastolic). It is best to have a blood pressure that is below 120/80. Healthy choices can help lower your blood pressure, or you may need medicine to help lower it. What are the causes? The cause of this condition is not known. Some conditions may be related to high blood pressure. What increases the risk?  Smoking.  Having type 2 diabetes mellitus, high cholesterol, or both.  Not getting enough exercise or physical activity.  Being overweight.  Having too much fat, sugar, calories, or salt (sodium) in your diet.  Drinking too much alcohol.  Having long-term (chronic) kidney disease.  Having a family history of high blood pressure.  Age. Risk increases with age.  Race. You may be at higher risk if you are African American.  Gender. Men are at higher risk than women before age 31. After age 55, women are at higher risk than men.  Having obstructive sleep apnea.  Stress. What are the signs or symptoms?  High blood pressure may not cause symptoms. Very high blood pressure (hypertensive crisis) may cause: ? Headache. ? Feelings of worry or nervousness (anxiety). ? Shortness of breath. ? Nosebleed. ? A feeling of being sick to your stomach (nausea). ? Throwing up (vomiting). ? Changes in how you see. ? Very bad chest pain. ? Seizures. How is this treated?  This condition is treated by making healthy lifestyle changes, such as: ? Eating healthy foods. ? Exercising more. ? Drinking less alcohol.  Your health care provider may prescribe medicine if lifestyle changes are not enough to get your blood pressure under control, and if: ? Your top number is above 130. ? Your bottom number is  above 80.  Your personal target blood pressure may vary. Follow these instructions at home: Eating and drinking   If told, follow the DASH eating plan. To follow this plan: ? Fill one half of your plate at each meal with fruits and vegetables. ? Fill one fourth of your plate at each meal with whole grains. Whole grains include whole-wheat pasta, brown rice, and whole-grain bread. ? Eat or drink low-fat dairy products, such as skim milk or low-fat yogurt. ? Fill one fourth of your plate at each meal with low-fat (lean) proteins. Low-fat proteins include fish, chicken without skin, eggs, beans, and tofu. ? Avoid fatty meat, cured and processed meat, or chicken with skin. ? Avoid pre-made or processed food.  Eat less than 1,500 mg of salt each day.  Do not drink alcohol if: ? Your doctor tells you not to drink. ? You are pregnant, may be pregnant, or are planning to become pregnant.  If you drink alcohol: ? Limit how much you use to:  0-1 drink a day for women.  0-2 drinks a day for men. ? Be aware of how much alcohol is in your  drink. In the [place] one drink equals one 12 oz bottle of beer (355 mL), one 5 oz glass of wine (148 mL), or one 1 oz glass of hard liquor (44 mL). Lifestyle   Work with your doctor to stay at a healthy weight or to lose weight. Ask your doctor what the best weight is for you.  Get at least 30 minutes of exercise most days of the week. This may include walking, swimming, or biking.  Get at least 30 minutes of exercise that strengthens your muscles (resistance exercise) at least 3 days a week. This may include lifting weights or doing Pilates.  Do not use any products that contain nicotine or tobacco, such as cigarettes, e-cigarettes, and chewing tobacco. If you need help quitting, ask your doctor.  Check your blood pressure at home as told by your doctor.  Keep all follow-up visits as told by your doctor. This is important. Medicines  Take  over-the-counter and prescription medicines only as told by your doctor. Follow directions carefully.  Do not skip doses of blood pressure medicine. The medicine does not work as well if you skip doses. Skipping doses also puts you at risk for problems.  Ask your doctor about side effects or reactions to medicines that you should watch for. Contact a doctor if you:  Think you are having a reaction to the medicine you are taking.  Have headaches that keep coming back (recurring).  Feel dizzy.  Have swelling in your ankles.  Have trouble with your vision. Get help right away if you:  Get a very bad headache.  Start to feel mixed up (confused).  Feel weak or numb.  Feel faint.  Have very bad pain in your: ? Chest. ? Belly (abdomen).  Throw up more than once.  Have trouble breathing. Summary  Hypertension is another name for high blood pressure.  High blood pressure forces your heart to work harder to pump blood.  For most people, a normal blood pressure is less than 120/80.  Making healthy choices can help lower blood pressure. If your blood pressure does not get lower with healthy choices, you may need to take medicine. This information is not intended to replace advice given to you by your health care provider. Make sure you discuss any questions you have with your health care provider. Document Released: 06/16/2007 Document Revised: 09/07/2017 Document Reviewed: 09/07/2017 Elsevier Patient Education  2020 [employer].

## 2018-09-14 NOTE — Progress Notes (Signed)
Raymond Costa is a 33 y.o. male  Chief Complaint  Patient presents with  . Follow-up    f/u BP/ not sure if cuff at home is accurate     HPI: Raymond Costa is a 33 y.o. male here for HTN f/u. Pt was seen by me on 08/31/18 and was switched from coreg to hyzaar 50-12.5mg  1 tab daily. He denies side effects.  He has a home BP cuff but is not sure if it is accurate. He has BP log with him today - 163/112, 154/112, 132/94, 166/108. He continues to intentionally lose weight with intermittent fasting during the week. He is drinking mostly water - cut out coffee/Starbucks as well as sweet tea. He is still working on limiting sweets.   Past Medical History:  Diagnosis Date  . Anxiety   . GERD (gastroesophageal reflux disease)   . Hypertension   . Obesity   . Sleep apnea    Not on cpap machine     Past Surgical History:  Procedure Laterality Date  . arm fracture surgery Left    33 years old     Social History   Socioeconomic History  . Marital status: Married    Spouse name: Not on file  . Number of children: Not on file  . Years of education: Not on file  . Highest education level: Not on file  Occupational History  . 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 Financial resource strain: Not on file  . Food insecurity    Worry: Not on file    Inability: Not on file  . Transportation needs    Medical: Not on file    Non-medical: Not on file  Tobacco Use  . Smoking status: Never Smoker  . Smokeless tobacco: Never Used  Substance and Sexual Activity  . Alcohol use: Yes    Frequency: Never    Comment: ocassionally  . Drug use: Never  . Sexual activity: Not on file  Lifestyle  . Physical activity    Days per week: Not on file    Minutes per session: Not on file  . Stress: Not on file  Relationships  . Social Musicianconnections    Talks on phone: Not on file    Gets together: Not on file    Attends religious service: Not on file    Active member of club or  organization: Not on file    Attends meetings of clubs or organizations: Not on file    Relationship status: Not on file  . Intimate partner violence    Fear of current or ex partner: Not on file    Emotionally abused: Not on file    Physically abused: Not on file    Forced sexual activity: Not on file  Other Topics Concern  . Not on file  Social History Narrative  . Not on file    Family History  Problem Relation Age of Onset  . Hypertension Mother   . Diabetes Mother   . Cancer Father        leukemia  . Prostate cancer Maternal Grandfather   . Colon cancer Neg Hx   . Esophageal cancer Neg Hx       There is no immunization history on file for this patient.  Outpatient Encounter Medications as of 09/14/2018  Medication Sig  . losartan-hydrochlorothiazide (HYZAAR) 50-12.5 MG tablet Take 1 tablet by mouth daily.  . pantoprazole (PROTONIX) 40 MG tablet Take 1 tablet (40 mg total) by  mouth 2 (two) times daily. Take one tablet by moth twice daily x 4 weeks and then once daily.   No facility-administered encounter medications on file as of 09/14/2018.      ROS: Pertinent positives and negatives noted in HPI. Remainder of ROS non-contributory   Allergies  Allergen Reactions  . Shellfish Allergy Hives    BP (!) 148/92   Pulse 96   Temp 98.5 F (36.9 C) (Oral)   Ht 5\' 11"  (1.803 m)   Wt (!) 366 lb (166 kg)   SpO2 97%   BMI 51.05 kg/m   BP Readings from Last 3 Encounters:  09/14/18 (!) 148/92  09/11/18 (!) 134/94  08/31/18 (!) 148/92   Wt Readings from Last 3 Encounters:  09/14/18 (!) 366 lb (166 kg)  09/11/18 (!) 367 lb 4 oz (166.6 kg)  08/31/18 (!) 368 lb 9.6 oz (167.2 kg)   Physical Exam  Constitutional: He is oriented to person, place, and time. He appears well-developed and well-nourished. No distress.  Cardiovascular: Normal rate, regular rhythm and normal heart sounds.  No murmur heard. Pulmonary/Chest: Effort normal and breath sounds normal. No  respiratory distress. He has no wheezes. He has no rhonchi. He has no rales.  Musculoskeletal:        General: No edema.  Neurological: He is alert and oriented to person, place, and time.  Psychiatric: He has a normal mood and affect. His behavior is normal.     A/P:  1. Essential hypertension - not at goal - increase hyzaar to 100-25mg  daily - cont to check BP at least 3x/wk and keep log - congratulated pt on dietary changes and weight loss and encouraged him to keep this up - f/u in 2 wks with BP log and advised pt to bring home BP cuff to that visit in order to ensure its accuracy

## 2018-09-19 ENCOUNTER — Encounter: Payer: Self-pay | Admitting: Family Medicine

## 2018-09-19 NOTE — Telephone Encounter (Signed)
Can we have him check BP at home to be sure he isn't getting too low.  Thanks!

## 2018-09-20 NOTE — Telephone Encounter (Signed)
I would recommend that he get a cuff to see what readings look like at home as well.  It does take a little time for the increase in losartan to start working well

## 2018-09-28 ENCOUNTER — Ambulatory Visit: Payer: BC Managed Care – PPO | Admitting: Family Medicine

## 2018-09-29 ENCOUNTER — Encounter: Payer: Self-pay | Admitting: Family Medicine

## 2018-09-29 ENCOUNTER — Encounter: Payer: Self-pay | Admitting: Nurse Practitioner

## 2018-09-29 ENCOUNTER — Other Ambulatory Visit: Payer: Self-pay

## 2018-09-29 ENCOUNTER — Other Ambulatory Visit (INDEPENDENT_AMBULATORY_CARE_PROVIDER_SITE_OTHER): Payer: BC Managed Care – PPO

## 2018-09-29 DIAGNOSIS — I10 Essential (primary) hypertension: Secondary | ICD-10-CM | POA: Diagnosis not present

## 2018-09-30 LAB — BASIC METABOLIC PANEL
BUN: 11 mg/dL (ref 7–25)
CO2: 25 mmol/L (ref 20–32)
Calcium: 9.8 mg/dL (ref 8.6–10.3)
Chloride: 98 mmol/L (ref 98–110)
Creat: 1.25 mg/dL (ref 0.60–1.35)
Glucose, Bld: 105 mg/dL — ABNORMAL HIGH (ref 65–99)
Potassium: 3.7 mmol/L (ref 3.5–5.3)
Sodium: 138 mmol/L (ref 135–146)

## 2018-10-04 ENCOUNTER — Ambulatory Visit: Payer: BC Managed Care – PPO | Admitting: Family Medicine

## 2018-10-05 ENCOUNTER — Encounter: Payer: Self-pay | Admitting: Family Medicine

## 2018-10-05 ENCOUNTER — Ambulatory Visit (INDEPENDENT_AMBULATORY_CARE_PROVIDER_SITE_OTHER): Payer: BC Managed Care – PPO | Admitting: Family Medicine

## 2018-10-05 ENCOUNTER — Other Ambulatory Visit: Payer: Self-pay

## 2018-10-05 VITALS — BP 150/102 | HR 122 | Temp 98.3°F | Ht 71.0 in | Wt 360.2 lb

## 2018-10-05 DIAGNOSIS — F419 Anxiety disorder, unspecified: Secondary | ICD-10-CM

## 2018-10-05 DIAGNOSIS — I10 Essential (primary) hypertension: Secondary | ICD-10-CM | POA: Diagnosis not present

## 2018-10-05 NOTE — Patient Instructions (Signed)
[email or web address]  [email or web address]  [email or web address]   buspar wellbutrin prozac zoloft

## 2018-10-05 NOTE — Progress Notes (Signed)
Raymond Costa is a 33 y.o. male  Chief Complaint  Patient presents with  . Follow-up    f/u HTN / denies flu shot     HPI: Raymond Costa is a 33 y.o. male here for HTN f/u. He is taking losartan 100-25mg  daily.  Pts home BP log: 155/97, 160/103, 142/109, 151/103, 142/106, 148/116 Pt is very anxious about his BP and health in general. He is crying or near-tears regarding his BP. Pt notes generalized anxiety, mind races. He has fear of sickness, death. He goes through "what if's" all of the time. His feels his anxiety is making his BP higher than it truly is and it is getting in the way of his quality of life. His wife feels pt needs help dealing with his anxiety. He denies HA, dizziness, vision changes, CP, SOB, LE edema. Pt has lost 7lbs in past 4 wks and 20+ lbs since 05/2018.   Past Medical History:  Diagnosis Date  . Anxiety   . GERD (gastroesophageal reflux disease)   . Hypertension   . Obesity   . Sleep apnea    Not on cpap machine     Past Surgical History:  Procedure Laterality Date  . arm fracture surgery Left    33 years old     Social History   Socioeconomic History  . Marital status: Married    Spouse name: Not on file  . Number of children: Not on file  . Years of education: Not on file  . Highest education level: Not on file  Occupational History  . Occupation: Teacher, early years/pre  Social Needs  . Financial resource strain: Not on file  . Food insecurity    Worry: Not on file    Inability: Not on file  . Transportation needs    Medical: Not on file    Non-medical: Not on file  Tobacco Use  . Smoking status: Never Smoker  . Smokeless tobacco: Never Used  Substance and Sexual Activity  . Alcohol use: Yes    Frequency: Never    Comment: ocassionally  . Drug use: Never  . Sexual activity: Not on file  Lifestyle  . Physical activity    Days per week: Not on file    Minutes per session: Not on file  . Stress: Not on file  Relationships  .  Social Musician on phone: Not on file    Gets together: Not on file    Attends religious service: Not on file    Active member of club or organization: Not on file    Attends meetings of clubs or organizations: Not on file    Relationship status: Not on file  . Intimate partner violence    Fear of current or ex partner: Not on file    Emotionally abused: Not on file    Physically abused: Not on file    Forced sexual activity: Not on file  Other Topics Concern  . Not on file  Social History Narrative  . Not on file    Family History  Problem Relation Age of Onset  . Hypertension Mother   . Diabetes Mother   . Cancer Father        leukemia  . Prostate cancer Maternal Grandfather   . Colon cancer Neg Hx   . Esophageal cancer Neg Hx       There is no immunization history on file for this patient.  Outpatient Encounter Medications as of 10/05/2018  Medication Sig  . losartan-hydrochlorothiazide (HYZAAR) 50-12.5 MG tablet Take 2 tablets by mouth daily.  . pantoprazole (PROTONIX) 40 MG tablet Take 1 tablet (40 mg total) by mouth 2 (two) times daily. Take one tablet by moth twice daily x 4 weeks and then once daily.   No facility-administered encounter medications on file as of 10/05/2018.      ROS: Pertinent positives and negatives noted in HPI. Remainder of ROS non-contributory    Allergies  Allergen Reactions  . Shellfish Allergy Hives    BP (!) 150/102   Pulse (!) 122   Temp 98.3 F (36.8 C) (Oral)   Ht 5\' 11"  (1.803 m)   Wt (!) 360 lb 3.2 oz (163.4 kg)   SpO2 98%   BMI 50.24 kg/m   BP Readings from Last 3 Encounters:  10/05/18 (!) 150/102  09/14/18 (!) 148/92  09/11/18 (!) 134/94   Wt Readings from Last 3 Encounters:  10/05/18 (!) 360 lb 3.2 oz (163.4 kg)  09/14/18 (!) 366 lb (166 kg)  09/11/18 (!) 367 lb 4 oz (166.6 kg)    Physical Exam  Constitutional: He is oriented to person, place, and time. He appears well-developed and  well-nourished. No distress.  Morbidly obese  Neck: Neck supple.  Cardiovascular: Normal rate and regular rhythm.  No murmur heard. Pulmonary/Chest: Effort normal and breath sounds normal. No respiratory distress. He has no wheezes.  Musculoskeletal:        General: No edema.  Neurological: He is alert and oriented to person, place, and time.  Psychiatric: His behavior is normal.  Tearful at times     A/P:  1. Essential hypertension - not at goal on losartan-HCTZ 100-25mg  daily - pt has lost weight (20+lbs since 05/2018) - pt has strong family h/o HTN but is also very anxious about his BP and about his overall health. I do feel pts anxiety is exacerbating/elevating his BP readings. His home BP log shows readings above goal and his office readings are also elevated. Will plan to continue with current med/dose at this time and continue with intentional weight loss - see below #2 - f/u in 4wks  2. Anxiety - GAD-7=15 - pt given info re: [facility] in order to make an appt. He was also given info about local therapist outside of [facility] and [email or web address]. He plans to make appt for [facility] counseling ASAP - pt declines medication at this time to treat his anxiety - f/u in 4 wks or sooner PRN  I personally spent 30 min with the patient today and greater than 50% was spent in counseling, coordination of care, education

## 2018-10-12 DIAGNOSIS — F411 Generalized anxiety disorder: Secondary | ICD-10-CM | POA: Diagnosis not present

## 2018-10-13 NOTE — Telephone Encounter (Signed)
-----   Message from [place], DO sent at 10/12/2018 12:45 PM EDT ----- This patient has intentionally lost weight and BMI now <50. Ok to schedule procedures [facility] now. Please assist and can get him off your [facility] list. Thanks!

## 2018-10-13 NOTE — Telephone Encounter (Addendum)
Called and spoke with patient-patient reports he is starting a new job and will have to call the office back to schedule his EGD at the [facility] when he can take time off of work; patient advised that the office will not call him to schedule this procedure -that he will need to notify us when he is available; patient advised to call back to the office should questions/concerns arise; Patient verbalized understanding of information/instructions;

## 2018-10-19 DIAGNOSIS — F4322 Adjustment disorder with anxiety: Secondary | ICD-10-CM | POA: Diagnosis not present

## 2018-10-24 ENCOUNTER — Encounter: Payer: Self-pay | Admitting: Family Medicine

## 2018-10-24 DIAGNOSIS — F4323 Adjustment disorder with mixed anxiety and depressed mood: Secondary | ICD-10-CM | POA: Diagnosis not present

## 2018-10-25 ENCOUNTER — Ambulatory Visit (INDEPENDENT_AMBULATORY_CARE_PROVIDER_SITE_OTHER): Payer: BC Managed Care – PPO | Admitting: Family Medicine

## 2018-10-25 ENCOUNTER — Other Ambulatory Visit: Payer: Self-pay

## 2018-10-25 ENCOUNTER — Encounter: Payer: Self-pay | Admitting: Family Medicine

## 2018-10-25 VITALS — BP 132/80 | HR 120 | Temp 97.3°F | Ht 71.0 in | Wt 348.0 lb

## 2018-10-25 DIAGNOSIS — R4589 Other symptoms and signs involving emotional state: Secondary | ICD-10-CM

## 2018-10-25 DIAGNOSIS — I10 Essential (primary) hypertension: Secondary | ICD-10-CM | POA: Diagnosis not present

## 2018-10-25 DIAGNOSIS — F418 Other specified anxiety disorders: Secondary | ICD-10-CM

## 2018-10-25 MED ORDER — LOSARTAN POTASSIUM-HCTZ 100-25 MG PO TABS: 1.0000 | ORAL_TABLET | Freq: Every day | ORAL | 3 refills | Status: DC

## 2018-10-25 MED ORDER — FLUOXETINE HCL 10 MG PO CAPS: 10.0000 mg | ORAL_CAPSULE | Freq: Every day | ORAL | 0 refills | Status: DC

## 2018-10-25 NOTE — Patient Instructions (Signed)

## 2018-10-25 NOTE — Progress Notes (Signed)
Raymond Costa is a 33 y.o. male  Chief Complaint  Patient presents with  . Anxiety    seeing counsler, 2 panic attacks last week    HPI: Raymond Costa is a 33 y.o. male here to f/u on anxiety, both generalized and about his health. Pt has established with a counselor Dennison Nancy). 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 He is very happy about this. Pt notes having a panic attack while driving home from a sales appt for work. He felt like his "body froze up". He states his fingers "get tingly and tense". His face feels flushed, heart racing. No SOB.  He feels "on edge", like he is always "afraid and worried". Symptoms are worse when he is alone and that is when he states "the 'what if' thoughts come in"  Diet: much-improved - all water, less bread/carbs, grilled foods vs fried, more green veggies  He stopped protonix yesterday because he read that it can cause "extreme anxiety". He thinks he feels better off of med.   GAD 7 : Generalized Anxiety Score 10/25/2018  Nervous, Anxious, on Edge 3  Control/stop worrying 3  Worry too much - different things 3  Trouble relaxing 2  Restless 2  Easily annoyed or irritable 0  Afraid - awful might happen 3  Total GAD 7 Score 16    Past Medical History:  Diagnosis Date  . Anxiety   . GERD (gastroesophageal reflux disease)   . Hypertension   . Obesity   . Sleep apnea    Not on cpap machine     Past Surgical History:  Procedure Laterality Date  . arm fracture surgery Left    34 years old     Social History   Socioeconomic History  . Marital status: Married    Spouse name: Not on file  . Number of children: Not on file  . Years of education: Not on file  . Highest education level: Not on file  Occupational History  . Occupation: Financial trader  Social Needs  . Financial resource strain: Not on file  .  Food insecurity    Worry: Not on file    Inability: Not on file  . Transportation needs    Medical: Not on file    Non-medical: Not on file  Tobacco Use  . Smoking status: Never Smoker  . Smokeless tobacco: Never Used  Substance and Sexual Activity  . Alcohol use: Yes    Frequency: Never    Comment: ocassionally  . Drug use: Never  . Sexual activity: Not on file  Lifestyle  . Physical activity    Days per week: Not on file    Minutes per session: Not on file  . Stress: Not on file  Relationships  . Social Herbalist on phone: Not on file    Gets together: Not on file    Attends religious service: Not on file    Active member of club or organization: Not on file    Attends meetings of clubs or organizations: Not on file    Relationship status: Not on file  . Intimate partner violence    Fear of current or ex partner: Not on file    Emotionally abused: Not on file    Physically abused: Not on file    Forced sexual activity: Not  on file  Other Topics Concern  . Not on file  Social History Narrative  . Not on file    Family History  Problem Relation Age of Onset  . Hypertension Mother   . Diabetes Mother   . Cancer Father        leukemia  . Prostate cancer Maternal Grandfather   . Colon cancer Neg Hx   . Esophageal cancer Neg Hx       There is no immunization history on file for this patient.  Outpatient Encounter Medications as of 10/25/2018  Medication Sig  . losartan-hydrochlorothiazide (HYZAAR) 50-12.5 MG tablet Take 2 tablets by mouth daily.  . pantoprazole (PROTONIX) 40 MG tablet Take 1 tablet (40 mg total) by mouth 2 (two) times daily. Take one tablet by moth twice daily x 4 weeks and then once daily.   No facility-administered encounter medications on file as of 10/25/2018.      ROS: Pertinent positives and negatives noted in HPI. Remainder of ROS non-contributory    Allergies  Allergen Reactions  . Shellfish Allergy Hives    BP  132/80   Pulse (!) 120   Temp (!) 97.3 F (36.3 C) (Temporal)   Ht 5\' 11"  (1.803 m)   Wt (!) 348 lb (157.9 kg)   SpO2 95%   BMI 48.54 kg/m   BP Readings from Last 3 Encounters:  10/25/18 132/80  10/05/18 (!) 150/102  09/14/18 (!) 148/92   Wt Readings from Last 3 Encounters:  10/25/18 (!) 348 lb (157.9 kg)  10/05/18 (!) 360 lb 3.2 oz (163.4 kg)  09/14/18 (!) 366 lb (166 kg)     Physical Exam  Constitutional: He is oriented to person, place, and time. He appears well-developed and well-nourished. No distress.  Neck: Neck supple. No JVD present.  Cardiovascular: Normal rate, regular rhythm and normal heart sounds.  No murmur heard. Pulmonary/Chest: Effort normal and breath sounds normal. No respiratory distress.  Musculoskeletal:        General: No edema.  Neurological: He is alert and oriented to person, place, and time.  Skin: Skin is warm and dry.  Psychiatric: He has a normal mood and affect. His behavior is normal.     A/P: 1. Anxiety about health - GAD-7 = 16 - pt is seeing therapist and feels this is helpful Rx: - FLUoxetine (PROZAC) 10 MG capsule; Take 1 capsule (10 mg total) by mouth daily.  Dispense: 90 capsule; Refill: 0 - f/u in 4 wks Discussed plan and reviewed medications with patient, including risks, benefits, and potential side effects. Pt expressed understand. All questions answered.  2. Essential hypertension - controlled, at goal - cont current med - f/u in 4 mo

## 2018-10-25 NOTE — Addendum Note (Signed)
Addended by: Ronnald Nian on: 10/25/2018 02:48 PM   Modules accepted: Orders

## 2018-11-01 DIAGNOSIS — F4322 Adjustment disorder with anxiety: Secondary | ICD-10-CM | POA: Diagnosis not present

## 2018-11-07 DIAGNOSIS — F4322 Adjustment disorder with anxiety: Secondary | ICD-10-CM | POA: Diagnosis not present

## 2018-11-15 ENCOUNTER — Telehealth: Payer: Self-pay

## 2018-11-15 NOTE — Telephone Encounter (Signed)

## 2018-11-16 ENCOUNTER — Other Ambulatory Visit: Payer: Self-pay

## 2018-11-16 ENCOUNTER — Ambulatory Visit: Payer: Self-pay | Admitting: Family Medicine

## 2018-11-16 ENCOUNTER — Encounter: Payer: Self-pay | Admitting: Family Medicine

## 2018-11-16 VITALS — BP 158/110 | HR 91 | Temp 97.0°F | Ht 71.0 in | Wt 348.6 lb

## 2018-11-16 DIAGNOSIS — F419 Anxiety disorder, unspecified: Secondary | ICD-10-CM

## 2018-11-16 NOTE — Progress Notes (Signed)
Chief Complaint  Patient presents with  . Follow-up    Follow up about anxiety patient said its better but today has been alot going on.    HPI: Raymond Costa is a 33 y.o. male here to f/u on his anxiety. At last OV on 10/25/18, pt was started on Prozac 10mg  daily. He has established care with a therapist and felt that was helpful. Today pt reports that overall his anxiety is "getting better". He is happier in his new job, more active. He still gets some physical symptoms - tingling sensation in fingers - but no overt panic attacks. He is sleeping better. He is still seeing his therapist and feels as a result of that it is easier for pt to calm himself down. He reports no side effects. Home BP readings have been in 130's/80's.   Pt states his 56yo grandfather was rushed to hospital this AM so pt is upset about this.   Today 11/16/18: GAD-7 = 12 PHQ-9 = 10  No flowsheet data found.  GAD 7 : Generalized Anxiety Score 10/25/2018  Nervous, Anxious, on Edge 3  Control/stop worrying 3  Worry too much - different things 3  Trouble relaxing 2  Restless 2  Easily annoyed or irritable 0  Afraid - awful might happen 3  Total GAD 7 Score 16      Past Medical History:  Diagnosis Date  . Anxiety   . GERD (gastroesophageal reflux disease)   . Hypertension   . Obesity   . Sleep apnea    Not on cpap machine     Past Surgical History:  Procedure Laterality Date  . arm fracture surgery Left    33 years old     Social History   Socioeconomic History  . Marital status: Married    Spouse name: Not on file  . Number of children: Not on file  . Years of education: Not on file  . Highest education level: Not on file  Occupational History  . Occupation: Financial trader  Social Needs  . Financial resource strain: Not on file  . Food insecurity    Worry: Not on file    Inability: Not on file  . Transportation needs    Medical: Not on file    Non-medical: Not on file  Tobacco  Use  . Smoking status: Never Smoker  . Smokeless tobacco: Never Used  Substance and Sexual Activity  . Alcohol use: Yes    Frequency: Never    Comment: ocassionally  . Drug use: Never  . Sexual activity: Not on file  Lifestyle  . Physical activity    Days per week: Not on file    Minutes per session: Not on file  . Stress: Not on file  Relationships  . Social Herbalist on phone: Not on file    Gets together: Not on file    Attends religious service: Not on file    Active member of club or organization: Not on file    Attends meetings of clubs or organizations: Not on file    Relationship status: Not on file  . Intimate partner violence    Fear of current or ex partner: Not on file    Emotionally abused: Not on file    Physically abused: Not on file    Forced sexual activity: Not on file  Other Topics Concern  . Not on file  Social History Narrative  . Not on file  Family History  Problem Relation Age of Onset  . Hypertension Mother   . Diabetes Mother   . Cancer Father        leukemia  . Prostate cancer Maternal Grandfather   . Colon cancer Neg Hx   . Esophageal cancer Neg Hx       There is no immunization history on file for this patient.  Outpatient Encounter Medications as of 11/16/2018  Medication Sig  . FLUoxetine (PROZAC) 10 MG capsule Take 1 capsule (10 mg total) by mouth daily.  Marland Kitchen losartan-hydrochlorothiazide (HYZAAR) 100-25 MG tablet Take 1 tablet by mouth daily.  . pantoprazole (PROTONIX) 40 MG tablet Take 1 tablet (40 mg total) by mouth 2 (two) times daily. Take one tablet by moth twice daily x 4 weeks and then once daily. (Patient not taking: Reported on 11/16/2018)   No facility-administered encounter medications on file as of 11/16/2018.      ROS: Pertinent positives and negatives noted in HPI. Remainder of ROS non-contributory    Allergies  Allergen Reactions  . Shellfish Allergy Hives    BP (!) 158/110   Pulse 91   Temp  (!) 97 F (36.1 C) (Temporal)   Ht 5\' 11"  (1.803 m)   Wt (!) 348 lb 9.6 oz (158.1 kg)   SpO2 98%   BMI 48.62 kg/m   BP Readings from Last 3 Encounters:  11/16/18 (!) 158/110  10/25/18 132/80  10/05/18 (!) 150/102     Physical Exam  Constitutional: He is oriented to person, place, and time. He appears well-developed and well-nourished.  Cardiovascular: Normal rate, regular rhythm and normal heart sounds.  Pulmonary/Chest: Effort normal and breath sounds normal.  Neurological: He is alert and oriented to person, place, and time.  Psychiatric: He has a normal mood and affect. His behavior is normal.     A/P:  1. Anxiety - GAD-7=12 (down from 16) - cont to see therapist as pt and I feel this is very beneficial - pt does not want to increase prozac from 10mg  to 20mg  at this time, but is agreeable to continuing on 10mg  daily - he will f/u via phone or mychart in 4 wks to let me know how he is doing and if he would like to increase dose vs continue current dose. I recommended trial of 20mg  daily to see if this further improves/controls his anxiety symptoms.

## 2018-11-16 NOTE — Patient Instructions (Signed)

## 2018-11-22 ENCOUNTER — Ambulatory Visit: Payer: BC Managed Care – PPO | Admitting: Family Medicine

## 2018-12-19 ENCOUNTER — Telehealth (INDEPENDENT_AMBULATORY_CARE_PROVIDER_SITE_OTHER): Payer: Self-pay | Admitting: Nurse Practitioner

## 2018-12-19 ENCOUNTER — Other Ambulatory Visit: Payer: Self-pay

## 2018-12-19 ENCOUNTER — Encounter: Payer: Self-pay | Admitting: Nurse Practitioner

## 2018-12-19 VITALS — BP 146/106 | HR 82 | Ht 71.0 in | Wt 342.0 lb

## 2018-12-19 DIAGNOSIS — I10 Essential (primary) hypertension: Secondary | ICD-10-CM | POA: Insufficient documentation

## 2018-12-19 DIAGNOSIS — J00 Acute nasopharyngitis [common cold]: Secondary | ICD-10-CM

## 2018-12-19 MED ORDER — FLUTICASONE PROPIONATE 50 MCG/ACT NA SUSP: 2.0000 | Freq: Every day | NASAL | 0 refills | Status: DC

## 2018-12-19 MED ORDER — CETIRIZINE HCL 10 MG PO TABS: 10.0000 mg | ORAL_TABLET | Freq: Every day | ORAL | 0 refills | Status: DC

## 2018-12-19 NOTE — Progress Notes (Signed)
Virtual Visit via Video Note  I connected with Raymond Costa on 12/19/18 at 10:15 AM EST by a video enabled telemedicine application and verified that I am speaking with the correct person using two identifiers.  Location: Patient: home Provider:office Participants: patient and provider I discussed the limitations of evaluation and management by telemedicine and the availability of in person appointments. The patient expressed understanding and agreed to proceed.  CC: pt is c/o sore throat,runny nose with clear mucus,little headache yesterday/started sunday/took cough drops/ request note for work?/ tested for covid in monday it was negative--FYI--pt said this BP machin is old--not sure if this is accurate.   History of Present Illness: Sore Throat  This is a new problem. The problem has been unchanged. Associated symptoms include congestion and headaches. Pertinent negatives include no abdominal pain, coughing, diarrhea, drooling, ear discharge, ear pain, hoarse voice, plugged ear sensation, neck pain, shortness of breath, stridor, swollen glands, trouble swallowing or vomiting. He has had no exposure to strep or mono. He has tried gargles for the symptoms. The treatment provided mild relief.   COVID test performed 2weeks ago. He denies any known exposure, he practices 3Ws at work and when out of his home. He denies any participation in any event events that involved a large group of people.   Reported BP is elevated, he reports compliance with losartan/HCTZ.  Observations/Objective: Physical Exam  Constitutional: He is oriented to person, place, and time. No distress.  Pulmonary/Chest: Effort normal.  Neurological: He is alert and oriented to person, place, and time.  Psychiatric: He has a normal mood and affect. His behavior is normal. Thought content normal.  Vitals reviewed.  Assessment and Plan: Raymond Costa was seen today for sore throat.  Diagnoses and all orders for this  visit:  Acute nasopharyngitis   Follow Up Instructions: See avs   I discussed the assessment and treatment plan with the patient. The patient was provided an opportunity to ask questions and all were answered. The patient agreed with the plan and demonstrated an understanding of the instructions.   The patient was advised to call back or seek an in-person evaluation if the symptoms worsen or if the condition fails to improve as anticipated.  Wilfred Lacy, NP

## 2018-12-19 NOTE — Patient Instructions (Addendum)
Avoid any OTC decongestant Take flonase and zyrtec for nasal congestion Maintain adequate oral hydration.  Continue to monitor BP daily F/up with pcp on 12/27/18 at 1pm

## 2018-12-22 ENCOUNTER — Other Ambulatory Visit: Payer: Self-pay | Admitting: Family Medicine

## 2018-12-22 DIAGNOSIS — F418 Other specified anxiety disorders: Secondary | ICD-10-CM

## 2018-12-22 MED ORDER — FLUOXETINE HCL 10 MG PO CAPS: 10.0000 mg | ORAL_CAPSULE | Freq: Every day | ORAL | 3 refills | Status: DC

## 2018-12-27 ENCOUNTER — Ambulatory Visit: Payer: Self-pay | Admitting: Family Medicine

## 2019-02-09 ENCOUNTER — Telehealth (INDEPENDENT_AMBULATORY_CARE_PROVIDER_SITE_OTHER): Payer: 59 | Admitting: Family Medicine

## 2019-02-09 ENCOUNTER — Ambulatory Visit: Payer: 59 | Attending: Internal Medicine

## 2019-02-09 ENCOUNTER — Encounter: Payer: Self-pay | Admitting: Family Medicine

## 2019-02-09 VITALS — Ht 71.0 in

## 2019-02-09 DIAGNOSIS — R1013 Epigastric pain: Secondary | ICD-10-CM | POA: Diagnosis not present

## 2019-02-09 DIAGNOSIS — Z20822 Contact with and (suspected) exposure to covid-19: Secondary | ICD-10-CM

## 2019-02-09 DIAGNOSIS — K219 Gastro-esophageal reflux disease without esophagitis: Secondary | ICD-10-CM

## 2019-02-09 NOTE — Patient Instructions (Signed)
Health Maintenance Due  Topic Date Due  . HIV Screening  07/06/2001  . TETANUS/TDAP  07/06/2005    Depression screen PHQ 2/9 11/16/2018  Decreased Interest 1  Down, Depressed, Hopeless 1  PHQ - 2 Score 2  Altered sleeping 1  Tired, decreased energy 1  Change in appetite 2  Feeling bad or failure about yourself  1  Trouble concentrating 1  Moving slowly or fidgety/restless 2  Suicidal thoughts 0  PHQ-9 Score 10

## 2019-02-09 NOTE — Progress Notes (Signed)
Virtual Visit via Video Note  I connected with Raymond Costa on 02/09/19 at  1:30 PM EST by a video enabled telemedicine application and verified that I am speaking with the correct person using two identifiers. Location patient: home Location provider: work  Persons participating in the virtual visit: patient, provider  I discussed the limitations of evaluation and management by telemedicine and the availability of in person appointments. The patient expressed understanding and agreed to proceed.  Chief Complaint  Patient presents with  . Abdominal Pain    Pt c/o stomach pain, upper ABD area, dull but eases up when laying down.  Pain started this morning and  pt has been burping alot also.  Pt did vomit this mornig as well.  He was working when this occured and was sent home for the day.    HPI: Raymond Costa is a 34 y.o. male complains of upper abdominal pain, increased belching, 1 episode of vomiting (pt states he belched and then "some stuff came up"). All symptoms since this AM. He describes the pain as dull, laying down helps relieve it.  Fever, chills, body aches, fatigue, URI symptoms, headache - no Constipation/diarrhea - no  OTC or at home meds/treatments - no Appetite is fine. No CP, SOB. He ate a lot of pizza earlier in the week.  Pt states he was sent home from work because of above symptoms.  He has a h/o GERD but is not on any meds.  Past Medical History:  Diagnosis Date  . Anxiety   . GERD (gastroesophageal reflux disease)   . Hypertension   . Obesity   . Sleep apnea    Not on cpap machine     Past Surgical History:  Procedure Laterality Date  . arm fracture surgery Left    34 years old     Family History  Problem Relation Age of Onset  . Hypertension Mother   . Diabetes Mother   . Cancer Father        leukemia  . Prostate cancer Maternal Grandfather   . Colon cancer Neg Hx   . Esophageal cancer Neg Hx     Social History   Tobacco Use  .  Smoking status: Never Smoker  . Smokeless tobacco: Never Used  Substance Use Topics  . Alcohol use: Yes    Comment: ocassionally  . Drug use: Never     Current Outpatient Medications:  .  FLUoxetine (PROZAC) 10 MG capsule, Take 1 capsule (10 mg total) by mouth daily., Disp: 90 capsule, Rfl: 3 .  losartan-hydrochlorothiazide (HYZAAR) 100-25 MG tablet, Take 1 tablet by mouth daily., Disp: 90 tablet, Rfl: 3 .  cetirizine (ZYRTEC) 10 MG tablet, Take 1 tablet (10 mg total) by mouth at bedtime. (Patient not taking: Reported on 02/09/2019), Disp: 14 tablet, Rfl: 0 .  fluticasone (FLONASE) 50 MCG/ACT nasal spray, Place 2 sprays into both nostrils daily. (Patient not taking: Reported on 02/09/2019), Disp: 16 g, Rfl: 0  Allergies  Allergen Reactions  . Shellfish Allergy Hives      ROS: See pertinent positives and negatives per HPI.   EXAM:  VITALS per patient if applicable: Ht 5\' 11"  (1.803 m)   BMI 47.70 kg/m    GENERAL: alert, oriented, and in no acute distress  HEENT: atraumatic, conjunctiva clear, no obvious abnormalities on inspection of external nose and ears  NECK: normal movements of the head and neck  LUNGS: on inspection no signs of respiratory distress, breathing rate appears normal, no  obvious gross SOB, gasping or wheezing, no conversational dyspnea  CV: no obvious cyanosis  PSYCH/NEURO: pleasant and cooperative, speech and thought processing grossly intact   ASSESSMENT AND PLAN:  1. Epigastric pain 2. Gastroesophageal reflux disease, unspecified whether esophagitis present - likely an exacerbation of pts GERD - recommend trial of pepcid or TUMS, reducing fried/fatty/greasy/spicy foods - ok to return to work and will send note to pt via [place] - f/u PRN Discussed plan and reviewed medications with patient, including risks, benefits, and potential side effects. Pt expressed understand. All questions answered.   I discussed the assessment and treatment plan  with the patient. The patient was provided an opportunity to ask questions and all were answered. The patient agreed with the plan and demonstrated an understanding of the instructions.   The patient was advised to call back or seek an in-person evaluation if the symptoms worsen or if the condition fails to improve as anticipated.   [name], DO

## 2019-02-10 LAB — NOVEL CORONAVIRUS, NAA: SARS-CoV-2, NAA: NOT DETECTED

## 2019-04-02 ENCOUNTER — Telehealth: Payer: 59 | Admitting: Nurse Practitioner

## 2019-04-02 ENCOUNTER — Ambulatory Visit: Payer: 59 | Attending: Internal Medicine

## 2019-04-02 DIAGNOSIS — K591 Functional diarrhea: Secondary | ICD-10-CM | POA: Diagnosis not present

## 2019-04-02 DIAGNOSIS — Z20822 Contact with and (suspected) exposure to covid-19: Secondary | ICD-10-CM

## 2019-04-02 NOTE — Progress Notes (Signed)
We are sorry that you are not feeling well.  Here is how we plan to help!  Based on what you have shared with me it looks like you have Acute Infectious Diarrhea.  Most cases of acute diarrhea are due to infections with virus and bacteria and are self-limited conditions lasting less than 14 days.  For your symptoms you may take Imodium 2 mg tablets that are over the counter at your local pharmacy. Take two tablet now and then one after each loose stool up to 6 a day.  Antibiotics are not needed for most people with diarrhea.   HOME CARE  We recommend changing your diet to help with your symptoms for the next few days.  Drink plenty of fluids that contain water salt and sugar. Sports drinks such as Gatorade may help.   You may try broths, soups, bananas, applesauce, soft breads, mashed potatoes or crackers.   You are considered infectious for as long as the diarrhea continues. Hand washing or use of alcohol based hand sanitizers is recommend.  It is best to stay out of work or school until your symptoms stop.   GET HELP RIGHT AWAY  If you have dark yellow colored urine or do not pass urine frequently you should drink more fluids.    If your symptoms worsen   If you feel like you are going to pass out (faint)  You have a new problem  MAKE SURE YOU   Understand these instructions.  Will watch your condition.  Will get help right away if you are not doing well or get worse.  Your e-visit answers were reviewed by a board certified advanced clinical practitioner to complete your personal care plan.  Depending on the condition, your plan could have included both over the counter or prescription medications.  If there is a problem please reply  once you have received a response from your provider.  Your safety is important to us.  If you have drug allergies check your prescription carefully.    You can use MyChart to ask questions about today's visit, request a non-urgent call  back, or ask for a work or school excuse for 24 hours related to this e-Visit. If it has been greater than 24 hours you will need to follow up with your provider, or enter a new e-Visit to address those concerns.   You will get an e-mail in the next two days asking about your experience.  I hope that your e-visit has been valuable and will speed your recovery. Thank you for using e-visits.  5-10 minutes spent reviewing and documenting in chart.  

## 2019-04-03 LAB — SARS-COV-2, NAA 2 DAY TAT

## 2019-04-03 LAB — NOVEL CORONAVIRUS, NAA: SARS-CoV-2, NAA: NOT DETECTED

## 2019-10-01 ENCOUNTER — Telehealth: Payer: 59 | Admitting: Family

## 2019-10-01 DIAGNOSIS — J069 Acute upper respiratory infection, unspecified: Secondary | ICD-10-CM

## 2019-10-01 MED ORDER — BENZONATATE 100 MG PO CAPS: 100.0000 mg | ORAL_CAPSULE | Freq: Three times a day (TID) | ORAL | 0 refills | Status: DC | PRN

## 2019-10-01 MED ORDER — FLUTICASONE PROPIONATE 50 MCG/ACT NA SUSP: 2.0000 | Freq: Every day | NASAL | 6 refills | Status: DC

## 2019-10-01 NOTE — Progress Notes (Signed)

## 2019-11-07 ENCOUNTER — Other Ambulatory Visit: Payer: Self-pay | Admitting: Family Medicine

## 2019-11-07 DIAGNOSIS — I10 Essential (primary) hypertension: Secondary | ICD-10-CM

## 2019-12-09 ENCOUNTER — Telehealth: Payer: Self-pay | Admitting: Family Medicine

## 2019-12-09 DIAGNOSIS — I10 Essential (primary) hypertension: Secondary | ICD-10-CM

## 2019-12-11 MED ORDER — LOSARTAN POTASSIUM-HCTZ 100-25 MG PO TABS: 1.0000 | ORAL_TABLET | Freq: Every day | ORAL | 0 refills | Status: DC

## 2019-12-11 NOTE — Telephone Encounter (Signed)
rx sent to the pharmacy and patient notified VIA phone. Dm/cma  

## 2019-12-11 NOTE — Telephone Encounter (Signed)
Pt called and scheduled appt 12/9. He is out of medication and requesting refill at least until his appt. Please advise.

## 2019-12-11 NOTE — Addendum Note (Signed)
Addended by: Waymond Cera on: 12/11/2019 02:14 PM   Modules accepted: Orders

## 2019-12-20 ENCOUNTER — Encounter: Payer: Self-pay | Admitting: Family Medicine

## 2019-12-20 ENCOUNTER — Telehealth (INDEPENDENT_AMBULATORY_CARE_PROVIDER_SITE_OTHER): Payer: HRSA Program | Admitting: Family Medicine

## 2019-12-20 VITALS — Ht 71.0 in | Wt 322.0 lb

## 2019-12-20 DIAGNOSIS — R350 Frequency of micturition: Secondary | ICD-10-CM

## 2019-12-20 DIAGNOSIS — U071 COVID-19: Secondary | ICD-10-CM

## 2019-12-20 DIAGNOSIS — I10 Essential (primary) hypertension: Secondary | ICD-10-CM | POA: Diagnosis not present

## 2019-12-20 MED ORDER — LOSARTAN POTASSIUM-HCTZ 100-25 MG PO TABS: 1.0000 | ORAL_TABLET | Freq: Every day | ORAL | 3 refills | Status: DC

## 2019-12-20 NOTE — Progress Notes (Signed)
Virtual Visit via Video Note  I connected with Raymond Costa on 12/20/19 at  1:30 PM EST by a video enabled telemedicine application and verified that I am speaking with the correct person using two identifiers. Location patient: home Location provider: work Persons participating in the virtual visit: patient, provider  I discussed the limitations of evaluation and management by telemedicine and the availability of in person appointments. The patient expressed understanding and agreed to proceed.  Chief Complaint  Patient presents with  . Acute Visit    Frequent urination 2-4 weeks.  Gets up 3-4 times a night.  Tested positive for covid 12/18/19. No flu or covid vaccines.       HPI: Raymond Costa is a 34 y.o. male seen today to discuss a couple of issues. 1. Pt tested positive for covid 2 days ago on 12/18/19. Pt endorses nasal congestion, cough. No SOB. No fever. + chills and diaphoresis. Leg and back pain earlier in the week but that has resolved. He has not lost taste or smell.  Wife with covid as well.   2. Pt notes increased urinary frequency x 3-4 weeks. Symptoms have improved somewhat in the past week. No dysuria. No gross hematuria. At night urine sometimes "onion" smell". No n/v.  3. He needs a refill of his BP med.  Past Medical History:  Diagnosis Date  . Anxiety   . GERD (gastroesophageal reflux disease)   . Hypertension   . Obesity   . Sleep apnea    Not on cpap machine     Past Surgical History:  Procedure Laterality Date  . arm fracture surgery Left    34 years old     Family History  Problem Relation Age of Onset  . Hypertension Mother   . Diabetes Mother   . Cancer Father        leukemia  . Prostate cancer Maternal Grandfather   . Colon cancer Neg Hx   . Esophageal cancer Neg Hx     Social History   Tobacco Use  . Smoking status: Never Smoker  . Smokeless tobacco: Never Used  Vaping Use  . Vaping Use: Never used  Substance Use Topics   . Alcohol use: Yes    Comment: ocassionally  . Drug use: Never     Current Outpatient Medications:  .  FLUoxetine (PROZAC) 10 MG capsule, Take 1 capsule (10 mg total) by mouth daily., Disp: 90 capsule, Rfl: 3 .  losartan-hydrochlorothiazide (HYZAAR) 100-25 MG tablet, Take 1 tablet by mouth daily., Disp: 30 tablet, Rfl: 0 .  benzonatate (TESSALON PERLES) 100 MG capsule, Take 1 capsule (100 mg total) by mouth 3 (three) times daily as needed. (Patient not taking: Reported on 12/20/2019), Disp: 20 capsule, Rfl: 0 .  fluticasone (FLONASE) 50 MCG/ACT nasal spray, Place 2 sprays into both nostrils daily. (Patient not taking: Reported on 12/20/2019), Disp: 16 g, Rfl: 6  Allergies  Allergen Reactions  . Shellfish Allergy Hives      ROS: See pertinent positives and negatives per HPI.   EXAM:  VITALS per patient if applicable: Ht 5\' 11"  (1.803 m)   Wt (!) 322 lb (146.1 kg) Comment: pt reported  BMI 44.91 kg/m    GENERAL: alert, oriented, in no acute distress  HEENT: atraumatic, conjunctiva clear, no obvious abnormalities on inspection of external nose and ears  NECK: normal movements of the head and neck  LUNGS: on inspection no signs of respiratory distress, breathing rate appears normal, no obvious gross SOB,  gasping or wheezing, no conversational dyspnea; pt is coughin  CV: no obvious cyanosis  PSYCH/NEURO: pleasant and cooperative, no obvious depression or anxiety, speech and thought processing grossly intact   ASSESSMENT AND PLAN:  1. Essential hypertension - controlled Refill: - losartan-hydrochlorothiazide (HYZAAR) 100-25 MG tablet; Take 1 tablet by mouth daily.  Dispense: 90 tablet; Refill: 3  2. COVID-19 virus infection - discussed supportive care and encouraged pt to contact office if symptoms worsen  3. Urinary frequency - symptoms improving - was having urinary frequency both day and nighttime, no other symptoms. Pt admits to trying to drink more water since he  is working to lose weight.  - if symptoms worsen or additional ones develop, pt will let me know and will order UA and culture    I discussed the assessment and treatment plan with the patient. The patient was provided an opportunity to ask questions and all were answered. The patient agreed with the plan and demonstrated an understanding of the instructions.   The patient was advised to call back or seek an in-person evaluation if the symptoms worsen or if the condition fails to improve as anticipated.   Luana Shu, DO

## 2020-01-07 ENCOUNTER — Other Ambulatory Visit: Payer: Self-pay | Admitting: Family Medicine

## 2020-01-07 DIAGNOSIS — F418 Other specified anxiety disorders: Secondary | ICD-10-CM

## 2020-01-07 NOTE — Telephone Encounter (Signed)
Last VV 12/20/2019 Last fill 12/22/18  #90/3

## 2020-04-01 ENCOUNTER — Other Ambulatory Visit: Payer: Self-pay

## 2020-04-01 ENCOUNTER — Encounter: Payer: Self-pay | Admitting: Physician Assistant

## 2020-04-01 ENCOUNTER — Ambulatory Visit (INDEPENDENT_AMBULATORY_CARE_PROVIDER_SITE_OTHER): Payer: BLUE CROSS/BLUE SHIELD | Admitting: Physician Assistant

## 2020-04-01 VITALS — BP 155/109 | HR 103 | Temp 97.8°F | Ht 71.0 in | Wt 339.2 lb

## 2020-04-01 DIAGNOSIS — B3749 Other urogenital candidiasis: Secondary | ICD-10-CM | POA: Diagnosis not present

## 2020-04-01 MED ORDER — CLOTRIMAZOLE 1 % EX CREA: 1.0000 "application " | TOPICAL_CREAM | Freq: Two times a day (BID) | CUTANEOUS | 0 refills | Status: DC

## 2020-04-01 NOTE — Patient Instructions (Signed)
Use the clotrimazole cream. Keep area clean and dry. Call back with results. Monitor BP!! Make sure to f/up with your PCP.

## 2020-04-01 NOTE — Progress Notes (Signed)
Acute Office Visit  Subjective:    Patient ID: Raymond Costa, male    DOB: 04-Feb-1985, 35 y.o.   MRN: [id number]  Chief Complaint  Patient presents with  . Penis Pain    HPI Patient is in today for penile pain x several months. He feels like there are cuts on it and it is not healing. He is married to his wife and monogamous. There has been some white build-up around the areas occasionally that itches. No discharge. No swelling. Tried cocoa butter and it helped soothe the pain some. No hx of STIs. He is circumcised, but feels like he has been needing to pull back the skin a lot when he showers. He is trying to work on losing weight.    Past Medical History:  Diagnosis Date  . Anxiety   . GERD (gastroesophageal reflux disease)   . Hypertension   . Obesity   . Sleep apnea    Not on cpap machine     Past Surgical History:  Procedure Laterality Date  . arm fracture surgery Left    35 years old     Family History  Problem Relation Age of Onset  . Hypertension Mother   . Diabetes Mother   . Cancer Father        leukemia  . Prostate cancer Maternal Grandfather   . Colon cancer Neg Hx   . Esophageal cancer Neg Hx     Social History   Socioeconomic History  . Marital status: Married    Spouse name: Not on file  . Number of children: Not on file  . Years of education: Not on file  . Highest education level: Not on file  Occupational History  . Occupation: Teacher, early years/pre  Tobacco Use  . Smoking status: Never Smoker  . Smokeless tobacco: Never Used  Vaping Use  . Vaping Use: Never used  Substance and Sexual Activity  . Alcohol use: Yes    Comment: ocassionally  . Drug use: Never  . Sexual activity: Not on file  Other Topics Concern  . Not on file  Social History Narrative  . Not on file   Social Determinants of Health   Financial Resource Strain: Not on file  Food Insecurity: Not on file  Transportation Needs: Not on file  Physical Activity: Not on file   Stress: Not on file  Social Connections: Not on file  Intimate Partner Violence: Not on file    Outpatient Medications Prior to Visit  Medication Sig Dispense Refill  . FLUoxetine (PROZAC) 10 MG capsule TAKE ONE CAPSULE BY MOUTH DAILY 90 capsule 3  . losartan-hydrochlorothiazide (HYZAAR) 100-25 MG tablet Take 1 tablet by mouth daily. 90 tablet 3   No facility-administered medications prior to visit.    Allergies  Allergen Reactions  . Shellfish Allergy Hives    Review of Systems REFER TO HPI FOR PERTINENT POSITIVES AND NEGATIVES     Objective:    Physical Exam Exam conducted with a chaperone present.  Constitutional:      General: He is not in acute distress.    Appearance: Normal appearance. He is obese.  Genitourinary:    Pubic Area: No rash.      Penis: Circumcised.      Testes: Normal.        Right: Mass, tenderness or swelling not present.        Left: Mass, tenderness or swelling not present.     Comments: Linear fissures surrounding shaft of penis  Neurological:     Mental Status: He is alert.     BP (!) 155/109   Pulse (!) 103   Temp 97.8 F (36.6 C)   Ht 5\' 11"  (1.803 m)   Wt (!) 339 lb 3.2 oz (153.9 kg)   SpO2 98%   BMI 47.31 kg/m  Wt Readings from Last 3 Encounters:  04/01/20 (!) 339 lb 3.2 oz (153.9 kg)  12/20/19 (!) 322 lb (146.1 kg)  12/19/18 (!) 342 lb (155.1 kg)       Assessment & Plan:   Problem List Items Addressed This Visit   None   Visit Diagnoses    Yeast dermatitis of penis    -  Primary   Relevant Medications   clotrimazole (LOTRIMIN) 1 % cream     1. Yeast dermatitis of penis Discussed with Dr. 14/08/20. Most likely fungal yeast infection of shaft of penis. Will try Clotrimazole cream as directed. Keep area clean and dry. May also consider oral medication if worse or no improvement. Did not look ulcerated, therefore did not think STIs like syphilis. Pt will let me know how he is doing.  This visit occurred during the  SARS-CoV-2 public health emergency.  Safety protocols were in place, including screening questions prior to the visit, additional usage of staff PPE, and extensive cleaning of exam room while observing appropriate contact time as indicated for disinfecting solutions.    Mars Scheaffer M [employer]init[employer] Hoefling, PA-C

## 2020-06-05 ENCOUNTER — Encounter: Payer: Self-pay | Admitting: Physician Assistant

## 2020-06-05 ENCOUNTER — Encounter: Payer: BLUE CROSS/BLUE SHIELD | Admitting: Physician Assistant

## 2020-06-05 ENCOUNTER — Telehealth: Payer: BLUE CROSS/BLUE SHIELD | Admitting: Physician Assistant

## 2020-06-05 DIAGNOSIS — B001 Herpesviral vesicular dermatitis: Secondary | ICD-10-CM

## 2020-06-05 DIAGNOSIS — R220 Localized swelling, mass and lump, head: Secondary | ICD-10-CM | POA: Diagnosis not present

## 2020-06-05 MED ORDER — VALACYCLOVIR HCL 1 G PO TABS: 2000.0000 mg | ORAL_TABLET | Freq: Two times a day (BID) | ORAL | 0 refills | Status: AC

## 2020-06-05 MED ORDER — MUPIROCIN CALCIUM 2 % EX CREA: 1.0000 "application " | TOPICAL_CREAM | Freq: Two times a day (BID) | CUTANEOUS | 0 refills | Status: AC

## 2020-06-05 NOTE — Progress Notes (Signed)
Patient did video visit for this today. Canceling e-visit. Erroneous -- no charge.

## 2020-06-05 NOTE — Progress Notes (Signed)
[name], Raymond Costa are scheduled for a virtual visit with your provider today.    Just as we do with appointments in the office, we must obtain your consent to participate.  Your consent will be active for this visit and any virtual visit you may have with one of our providers in the next 365 days.    If you have a MyChart account, I can also send a copy of this consent to you electronically.  All virtual visits are billed to your insurance company just like a traditional visit in the office.  As this is a virtual visit, video technology does not allow for your provider to perform a traditional examination.  This may limit your provider's ability to fully assess your condition.  If your provider identifies any concerns that need to be evaluated in person or the need to arrange testing such as labs, EKG, etc, we will make arrangements to do so.    Although advances in technology are sophisticated, we cannot ensure that it will always work on either your end or our end.  If the connection with a video visit is poor, we may have to switch to a telephone visit.  With either a video or telephone visit, we are not always able to ensure that we have a secure connection.   I need to obtain your verbal consent now.   Are you willing to proceed with your visit today?   Raymond Costa has provided verbal consent on 06/05/2020 for a virtual visit (video or telephone).  Raymond Climes, PA-C 06/05/2020  9:43 AM  Virtual Visit via Video   I connected with patient on 06/05/20 at  9:45 AM EDT by a video enabled telemedicine application and verified that I am speaking with the correct person using two identifiers.  Location patient: Home Location provider: [facility] Persons participating in the virtual visit: Patient, Provider  I discussed the limitations of evaluation and management by telemedicine and the availability of in person appointments. The patient expressed understanding and  agreed to proceed.  Subjective:   HPI:   Patient presents via [facility] today c/o sore above the upper lip (r-side) noted on waking this morning. Notes some sensitivity in the area without any overt soreness or pain itself. Has also noted some mild swelling of the upper lip in this area only. Denies fever, chills. Denies tongue swelling, itching or SOB. Denies change to soaps, lotions, etc. Denies any trauma or injury to the area. Denies recent trimming of his mustache or beard. Has history of cold sores but not recently. Also has history of mild folliculitis in the past but notes that is usually more painful and not associated with swelling like his HSV. .  ROS:   See pertinent positives and negatives per HPI.  Patient Active Problem List   Diagnosis Date Noted  . Essential hypertension 12/19/2018    Social History   Tobacco Use  . Smoking status: Never Smoker  . Smokeless tobacco: Never Used  Substance Use Topics  . Alcohol use: Yes    Comment: ocassionally    Current Outpatient Medications:  .  clotrimazole (LOTRIMIN) 1 % cream, Apply 1 application topically 2 (two) times daily., Disp: 40 g, Rfl: 0 .  FLUoxetine (PROZAC) 10 MG capsule, TAKE ONE CAPSULE BY MOUTH DAILY, Disp: 90 capsule, Rfl: 3 .  losartan-hydrochlorothiazide (HYZAAR) 100-25 MG tablet, Take 1 tablet by mouth daily., Disp: 90 tablet, Rfl: 3  Allergies  Allergen Reactions  .  Shellfish Allergy Hives    Objective:   There were no vitals taken for this visit.  Patient is well-developed, well-nourished in no acute distress.  Resting comfortably at home.  Head is normocephalic, atraumatic.  No labored breathing.  Speech is clear and coherent with logical content.  Patient is alert and oriented at baseline.  Skin of face viewed via video visit.  There is a area of erythema with small vesicular lesions noted.  There is mild swelling of upper lip and face in this area, that is highly localized around the lesion.   No evidence of purulent drainage or other skin change.  Assessment and Plan:   1. Cold sore 2. Lip swelling R upper lip and superiorly. No nasal involvement per video examination. Discussed a mild folliculitis cannot be fully ruled out giving how hard it is to visualize the full area due to his mustache. Will start Valtrex for suspected HSV. Benadryl and cold compresses for mild swelling associated with infection. He is to avoid trimming or shaving in this area until all symptoms resolve. Will give him a small dose of topical mupirocin to have on hand for episodes of mild folliculitis. If current symptoms not resolving, anything new occurs or any of the mild swelling worsens, he it to seek care in person ASAP with PCP or UC. ER precautions reviewed with patient.     Raymond Costa, [place] 06/05/2020

## 2020-06-05 NOTE — Patient Instructions (Signed)
Instructions sent to patients MyChart.

## 2020-08-13 ENCOUNTER — Telehealth: Payer: BLUE CROSS/BLUE SHIELD | Admitting: Physician Assistant

## 2020-08-13 DIAGNOSIS — R519 Headache, unspecified: Secondary | ICD-10-CM

## 2020-08-13 MED ORDER — FLUTICASONE PROPIONATE 50 MCG/ACT NA SUSP
2.0000 | Freq: Every day | NASAL | 0 refills | Status: DC
Start: 2020-08-13 — End: 2021-01-19

## 2020-08-13 NOTE — Progress Notes (Signed)
Virtual Visit Consent   [name], you are scheduled for a virtual visit with a [facility] provider today.     Just as with appointments in the office, your consent must be obtained to participate.  Your consent will be active for this visit and any virtual visit you may have with one of our providers in the next 365 days.     If you have a MyChart account, a copy of this consent can be sent to you electronically.  All virtual visits are billed to your insurance company just like a traditional visit in the office.    As this is a virtual visit, video technology does not allow for your provider to perform a traditional examination.  This may limit your provider's ability to fully assess your condition.  If your provider identifies any concerns that need to be evaluated in person or the need to arrange testing (such as labs, EKG, etc.), we will make arrangements to do so.     Although advances in technology are sophisticated, we cannot ensure that it will always work on either your end or our end.  If the connection with a video visit is poor, the visit may have to be switched to a telephone visit.  With either a video or telephone visit, we are not always able to ensure that we have a secure connection.     I need to obtain your verbal consent now.   Are you willing to proceed with your visit today?    Raymond Costa has provided verbal consent on 08/13/2020 for a virtual visit (video or telephone).   Raymond Costa, [place]   Date: 08/13/2020 2:41 PM   Virtual Visit via Video Note   I, Raymond Costa, connected with  Kendra Woolford  ([id number], April 14, 1985) on 08/13/20 at  2:30 PM EDT by a video-enabled telemedicine application and verified that I am speaking with the correct person using two identifiers.  Location: Patient: Virtual Visit Location Patient: Home Provider: Virtual Visit Location Provider: Home Office   I discussed the limitations of evaluation and  management by telemedicine and the availability of in person appointments. The patient expressed understanding and agreed to proceed.    History of Present Illness: Raymond Costa is a 35 y.o. who identifies as a male who was assigned male at birth, and is being seen today for headache x 1.5 days. Notes symptoms starting yesterday evening with throbbing, intermittent and L frontal headache. Went to bed and woke up with headache still present with sinus pressure and nasal congestion. Noting L maxillary fullness as well. Denies fever, chills, nausea or vomiting. Denies vision change, photophobia or phonophobia. Denies any head injury. 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 Denies any recent travel or sick contact. Headache is milder now compared to last night and this morning. Did take Ibuprofen this morning which helped considerably.  HPI: HPI  Problems:  Patient Active Problem List   Diagnosis Date Noted   Essential hypertension 12/19/2018    Allergies:  Allergies  Allergen Reactions   Shellfish Allergy Hives   Medications:  Current Outpatient Medications:    fluticasone (FLONASE) 50 MCG/ACT nasal spray, Place 2 sprays into both nostrils daily., Disp: 16 g, Rfl: 0   clotrimazole (LOTRIMIN) 1 % cream, Apply 1 application topically 2 (two) times daily., Disp: 40 g, Rfl: 0   FLUoxetine (PROZAC) 10 MG capsule, TAKE ONE CAPSULE BY MOUTH DAILY, Disp: 90 capsule, Rfl:  3   losartan-hydrochlorothiazide (HYZAAR) 100-25 MG tablet, Take 1 tablet by mouth daily., Disp: 90 tablet, Rfl: 3  Observations/Objective: Patient is well-developed, well-nourished in no acute distress.  Resting comfortably at home.  Head is normocephalic, atraumatic.  No labored breathing. Speech is clear and coherent with logical content.  Patient is alert and oriented at baseline.   Assessment and Plan: 1. Sinus headache - fluticasone (FLONASE) 50 MCG/ACT nasal spray; Place 2 sprays into both  nostrils daily.  Dispense: 16 g; Refill: 0 Starting yesterday. With mild nasal congestion and sinus pressure. Has been outside a lot so likely allergen-mediated. No true sinusitis suspected at present. Discussed that thankfully no other concerning symptoms of COVID but giving high rates in the area, if he notes any new URI symptoms with this or other worsening symptom, he needs testing to be cautious. Will have him start Xyzal OTC. Rx Flonase sent in. Saline nasal rinses recommended. Can alternate tylenol and ibuprofen. If symptoms are not resolving, recommend in person evaluation.  Follow Up Instructions: I discussed the assessment and treatment plan with the patient. The patient was provided an opportunity to ask questions and all were answered. The patient agreed with the plan and demonstrated an understanding of the instructions.  A copy of instructions were sent to the patient via MyChart.  The patient was advised to call back or seek an in-person evaluation if the symptoms worsen or if the condition fails to improve as anticipated.  Time:  I spent 13 minutes with the patient via telehealth technology discussing the above problems/concerns.    Raymond Climes, PA-C

## 2020-08-13 NOTE — Patient Instructions (Signed)
  Michael Litter, thank you for joining [name], PA-C for today's virtual visit.  While this provider is not your primary care provider (PCP), if your PCP is located in our provider database this encounter information will be shared with them immediately following your visit.  Consent: (Patient) Raymond Costa provided verbal consent for this virtual visit at the beginning of the encounter.  Current Medications:  Current Outpatient Medications:    clotrimazole (LOTRIMIN) 1 % cream, Apply 1 application topically 2 (two) times daily., Disp: 40 g, Rfl: 0   FLUoxetine (PROZAC) 10 MG capsule, TAKE ONE CAPSULE BY MOUTH DAILY, Disp: 90 capsule, Rfl: 3   losartan-hydrochlorothiazide (HYZAAR) 100-25 MG tablet, Take 1 tablet by mouth daily., Disp: 90 tablet, Rfl: 3   Medications ordered in this encounter:  No orders of the defined types were placed in this encounter.    *If you need refills on other medications prior to your next appointment, please contact your pharmacy*  Follow-Up: Call back or seek an in-person evaluation if the symptoms worsen or if the condition fails to improve as anticipated.  Other Instructions Keep well-hydrated and get plenty of rest.  Start a saline nasal rinse and OTC Xyzal once daily. Use the Flonase I have sent in. You can alternate tylenol and ibuprofen if needed. If you note any new or worsening respiratory/sinus symptoms, I want you to be COVID tested to be cautious.  If symptoms are not resolving, anything worsens or headache becomes more significant, you need an in-person evaluation ASAP.    If you have been instructed to have an in-person evaluation today at a local Urgent Care facility, please use the link below. It will take you to a list of all of our available [facility], including address, phone number and hours of operation. Please do not delay care.  [facility]  If you or a family member do not have a  primary care provider, use the link below to schedule a visit and establish care. When you choose a [facility] primary care physician or advanced practice provider, you gain a long-term partner in health. Find a Primary Care Provider  Learn more about [facility] in-office and virtual care options: [facility] - Get Care Now

## 2020-08-20 IMAGING — CR CHEST - 2 VIEW
2 series · 2 of 2 positions shown · non-contrast
Comparison: None.

CLINICAL DATA: Heart fluttering this morning; no chest hx

EXAM:
CHEST - 2 VIEW

[w chest pa]
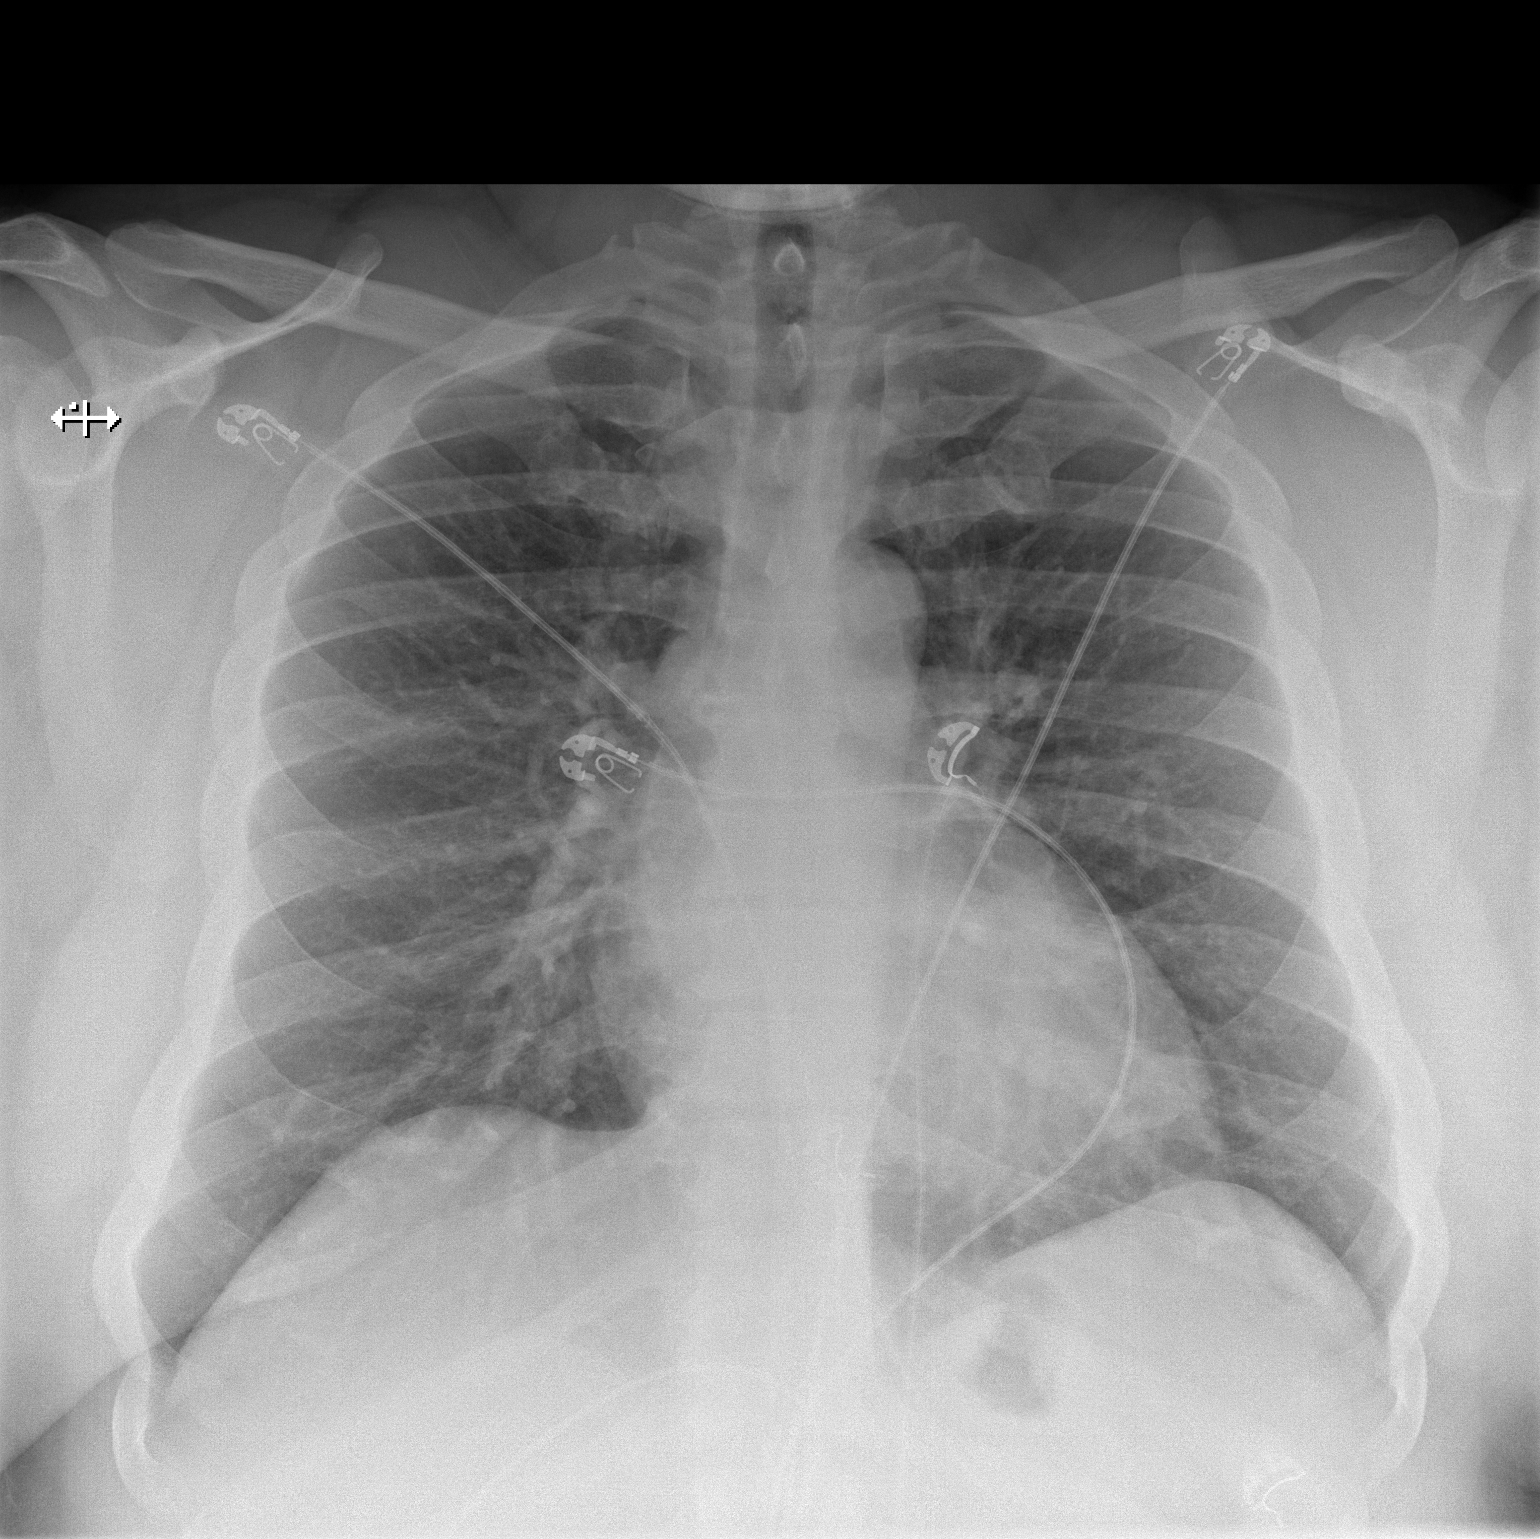

[w chest lat]
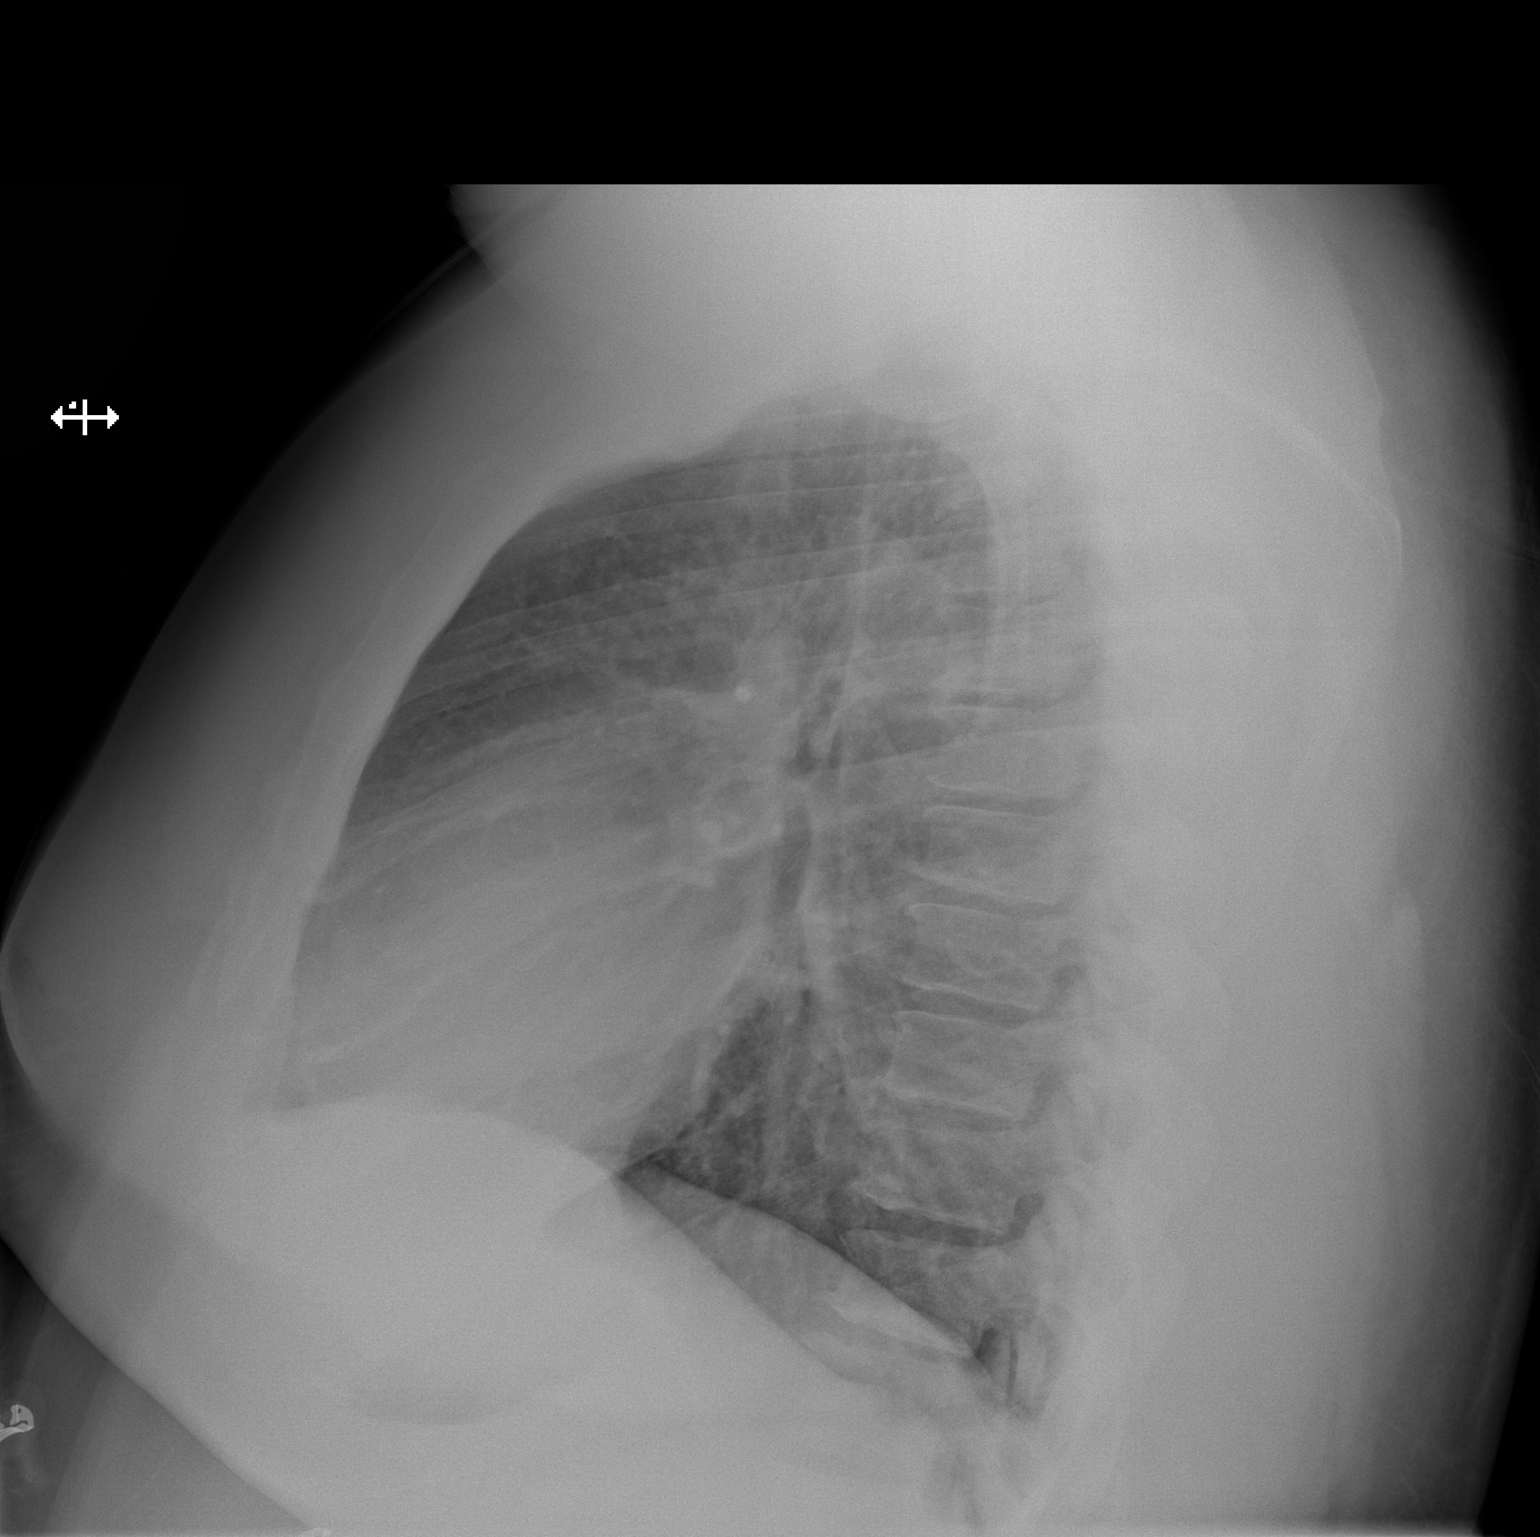

[2 of 2 positions shown; findings below may reference images not displayed]

FINDINGS: The heart size and mediastinal contours are within normal limits.
The lungs are clear. No pneumothorax or pleural effusion. The
visualized skeletal structures are unremarkable.
IMPRESSION: No acute cardiopulmonary process

## 2020-09-04 ENCOUNTER — Other Ambulatory Visit: Payer: Self-pay

## 2020-09-04 ENCOUNTER — Encounter: Payer: Self-pay | Admitting: Physician Assistant

## 2020-09-04 ENCOUNTER — Ambulatory Visit (INDEPENDENT_AMBULATORY_CARE_PROVIDER_SITE_OTHER): Payer: BLUE CROSS/BLUE SHIELD | Admitting: Physician Assistant

## 2020-09-04 VITALS — BP 134/82 | HR 101 | Temp 97.2°F | Wt 343.4 lb

## 2020-09-04 DIAGNOSIS — I10 Essential (primary) hypertension: Secondary | ICD-10-CM | POA: Diagnosis not present

## 2020-09-04 DIAGNOSIS — F419 Anxiety disorder, unspecified: Secondary | ICD-10-CM

## 2020-09-04 NOTE — Progress Notes (Signed)
Established Patient Office Visit  Subjective:  Patient ID: Raymond Costa, male    DOB: Apr 16, 1985  Age: 35 y.o. MRN: [id number]  CC:  Chief Complaint  Patient presents with   Transitions Of Care    HPI Raymond Costa presents for [facility] visit. Happily married. Trying for kids. Works from [employer]. 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   No acute concerns today. Will eventually need refills, but says he is ok for now.  Anxiety - Currently taking Prozac 10 mg for about the last year and a half. Started during the COVID pandemic. Tends to be worse heading into Fall and Winter. Driving long distances worsens it. Has previously seen therapist which was helpful & she gave him coping techniques.  HTN - Takes Hyzaar daily, but states this is a constant worry for him because of family history.   Past Medical History:  Diagnosis Date   Anxiety    GERD (gastroesophageal reflux disease)    Hypertension    Obesity    Sleep apnea    Not on cpap machine     Past Surgical History:  Procedure Laterality Date   arm fracture surgery Left    35 years old     Family History  Problem Relation Age of Onset   Hypertension Mother    Diabetes Mother    Cancer Father        leukemia   Prostate cancer Maternal Grandfather    Colon cancer Neg Hx    Esophageal cancer Neg Hx     Social History   Socioeconomic History   Marital status: Married    Spouse name: Not on file   Number of children: Not on file   Years of education: Not on file   Highest education level: Not on file  Occupational History   Occupation: Tech Support  Tobacco Use   Smoking status: Never   Smokeless tobacco: Never  Vaping Use   Vaping Use: Never used  Substance and Sexual Activity   Alcohol use: Yes    Comment: ocassionally   Drug use: Never   Sexual activity: Yes  Other Topics Concern   Not on file  Social History Narrative   Not on file   Social Determinants of Health   Financial  Resource Strain: Not on file  Food Insecurity: Not on file  Transportation Needs: Not on file  Physical Activity: Not on file  Stress: Not on file  Social Connections: Not on file  Intimate Partner Violence: Not on file    Outpatient Medications Prior to Visit  Medication Sig Dispense Refill   clotrimazole (LOTRIMIN) 1 % cream Apply 1 application topically 2 (two) times daily. 40 g 0   FLUoxetine (PROZAC) 10 MG capsule TAKE ONE CAPSULE BY MOUTH DAILY 90 capsule 3   fluticasone (FLONASE) 50 MCG/ACT nasal spray Place 2 sprays into both nostrils daily. 16 g 0   losartan-hydrochlorothiazide (HYZAAR) 100-25 MG tablet Take 1 tablet by mouth daily. 90 tablet 3   No facility-administered medications prior to visit.    Allergies  Allergen Reactions   Shellfish Allergy Hives    ROS Review of Systems REFER TO HPI FOR PERTINENT POSITIVES AND NEGATIVES    Objective:    Physical Exam Vitals and nursing note reviewed.  Constitutional:      Appearance: Normal appearance. He is obese.  HENT:     Head: Normocephalic and atraumatic.     Right Ear: External ear normal.     Left  Ear: External ear normal.     Nose: Nose normal.  Cardiovascular:     Rate and Rhythm: Normal rate and regular rhythm.     Pulses: Normal pulses.     Heart sounds: Normal heart sounds. No murmur heard. Pulmonary:     Effort: Pulmonary effort is normal.     Breath sounds: Normal breath sounds.  Skin:    General: Skin is warm and dry.  Neurological:     General: No focal deficit present.     Mental Status: He is alert and oriented to person, place, and time.  Psychiatric:        Mood and Affect: Mood normal.        Behavior: Behavior normal.    BP 134/82   Pulse (!) 101   Temp (!) 97.2 F (36.2 C) (Temporal)   Wt (!) 343 lb 6.4 oz (155.8 kg)   SpO2 97%   BMI 47.89 kg/m  Wt Readings from Last 3 Encounters:  09/04/20 (!) 343 lb 6.4 oz (155.8 kg)  04/01/20 (!) 339 lb 3.2 oz (153.9 kg)  12/20/19 (!)  322 lb (146.1 kg)     Health Maintenance Due  Topic Date Due   COVID-19 Vaccine (1) Never done   HIV Screening  Never done   Hepatitis C Screening  Never done   TETANUS/TDAP  Never done   INFLUENZA VACCINE  08/11/2020    There are no preventive care reminders to display for this patient.  Lab Results  Component Value Date   TSH 2.109 08/30/2018   Lab Results  Component Value Date   WBC 6.0 08/30/2018   HGB 16.3 08/30/2018   HCT 44.7 08/30/2018   MCV 80.7 08/30/2018   PLT 317 08/30/2018   Lab Results  Component Value Date   NA 138 09/29/2018   K 3.7 09/29/2018   CO2 25 09/29/2018   GLUCOSE 105 (H) 09/29/2018   BUN 11 09/29/2018   CREATININE 1.25 09/29/2018   AST 40 (H) 06/29/2018   ALT 66 (H) 06/29/2018   CALCIUM 9.8 09/29/2018   ANIONGAP 10 08/30/2018   GFR 101.28 06/29/2018   Lab Results  Component Value Date   CHOL 133 06/29/2018   Lab Results  Component Value Date   HDL 27.60 (L) 06/29/2018   Lab Results  Component Value Date   LDLCALC 78 06/29/2018   Lab Results  Component Value Date   TRIG 140.0 06/29/2018   Lab Results  Component Value Date   CHOLHDL 5 06/29/2018   No results found for: HGBA1C    Assessment & Plan:   Problem List Items Addressed This Visit       Cardiovascular and Mediastinum   Essential hypertension - Primary   Other Visit Diagnoses     Anxiety           No orders of the defined types were placed in this encounter.   Follow-up: Return for 4-6 months for fasting labs and CPE .   1. Essential hypertension Stable, to goal. Continue Hyzaar 100-25 mg daily. DASH diet. Plenty of exercise.  2. Anxiety Doing well. Much improved. Stable on Prozac 10 mg daily.  GAD 7 : Generalized Anxiety Score 09/04/2020 11/16/2018 10/25/2018  Nervous, Anxious, on Edge 1 2 3   Control/stop worrying 1 2 3   Worry too much - different things 1 2 3   Trouble relaxing 0 1 2  Restless 0 2 2  Easily annoyed or irritable 1 1 0   Afraid -  awful might happen 1 2 3   Total GAD 7 Score 5 12 16       Delta Deshmukh M Fontaine Hehl, PA-C

## 2020-09-04 NOTE — Patient Instructions (Addendum)
Great to see you again! Continue current medication regimen. Keep up great work with lifestyle changes and weight loss. BP looks good. Anxiety is stable.  See you back in 4-6 months for labs and physical. Call if any concerns!

## 2020-10-07 ENCOUNTER — Telehealth: Payer: BLUE CROSS/BLUE SHIELD | Admitting: Nurse Practitioner

## 2020-10-07 ENCOUNTER — Encounter: Payer: Self-pay | Admitting: Nurse Practitioner

## 2020-10-07 DIAGNOSIS — J029 Acute pharyngitis, unspecified: Secondary | ICD-10-CM

## 2020-10-07 NOTE — Progress Notes (Signed)
See e visit note

## 2020-10-07 NOTE — Progress Notes (Signed)
E-Visit for Sore Throat  We are sorry that you are not feeling well.  Here is how we plan to help!  The current COVID symptoms have included a very sore throat, our best recommendation would be for you to take a home COVID test or visit a local urgent care where you can be tested for COVID to assure your symptoms are not from that.   If you test positive for COVID please let us know.   Your symptoms indicate a likely viral infection (Pharyngitis).   Pharyngitis is inflammation in the back of the throat which can cause a sore throat, scratchiness and sometimes difficulty swallowing.   Pharyngitis is typically caused by a respiratory virus and will just run its course.  Please keep in mind that your symptoms could last up to 10 days.  For throat pain, we recommend over the counter oral pain relief medications such as acetaminophen or aspirin, or anti-inflammatory medications such as ibuprofen or naproxen sodium.  Topical treatments such as oral throat lozenges or sprays may be used as needed.  Avoid close contact with loved ones, especially the very young and elderly.  Remember to wash your hands thoroughly throughout the day as this is the number one way to prevent the spread of infection and wipe down door knobs and counters with disinfectant.  After careful review of your answers, I would not recommend and antibiotic for your condition.  Antibiotics should not be used to treat conditions that we suspect are caused by viruses like the virus that causes the common cold or flu. However, some people can have Strep with atypical symptoms. You may need formal testing in clinic or office to confirm if your symptoms continue or worsen.  Providers prescribe antibiotics to treat infections caused by bacteria. Antibiotics are very powerful in treating bacterial infections when they are used properly.  To maintain their effectiveness, they should be used only when necessary.  Overuse of antibiotics has resulted  in the development of super bugs that are resistant to treatment!    Home Care: Only take medications as instructed by your medical team. Do not drink alcohol while taking these medications. A steam or ultrasonic humidifier can help congestion.  You can place a towel over your head and breathe in the steam from hot water coming from a faucet. Avoid close contacts especially the very young and the elderly. Cover your mouth when you cough or sneeze. Always remember to wash your hands.  Get Help Right Away If: You develop worsening fever or throat pain. You develop a severe head ache or visual changes. Your symptoms persist after you have completed your treatment plan.  Make sure you Understand these instructions. Will watch your condition. Will get help right away if you are not doing well or get worse.   Thank you for choosing an e-visit.  Your e-visit answers were reviewed by a board certified advanced clinical practitioner to complete your personal care plan. Depending upon the condition, your plan could have included both over the counter or prescription medications.  Please review your pharmacy choice. Make sure the pharmacy is open so you can pick up prescription now. If there is a problem, you may contact your provider through [employer] and have the prescription routed to another pharmacy.  Your safety is important to [place]. If you have drug allergies check your prescription carefully.   For the next 24 hours you can use MyChart to ask questions about today's visit, request a non-urgent  call back, or ask for a work or school excuse. You will get an email in the next two days asking about your experience. I hope that your e-visit has been valuable and will speed your recovery.   I spent approximately 7 minutes reviewing the patient's history, current symptoms and coordinating their plan of care today.

## 2020-10-09 ENCOUNTER — Encounter: Payer: Self-pay | Admitting: Nurse Practitioner

## 2021-01-06 ENCOUNTER — Telehealth: Payer: Self-pay | Admitting: Physician Assistant

## 2021-01-06 ENCOUNTER — Other Ambulatory Visit: Payer: Self-pay

## 2021-01-06 DIAGNOSIS — I10 Essential (primary) hypertension: Secondary | ICD-10-CM

## 2021-01-06 MED ORDER — LOSARTAN POTASSIUM-HCTZ 100-25 MG PO TABS: 1.0000 | ORAL_TABLET | Freq: Every day | ORAL | 0 refills | Status: DC

## 2021-01-06 NOTE — Telephone Encounter (Signed)
Medication:  FLUoxetine (PROZAC) 10 MG capsule [[id number]]  losartan-hydrochlorothiazide (HYZAAR) 100-25 MG tablet [[id number]]  Has the patient contacted their pharmacy? No. (If no, request that the patient contact the pharmacy for the refill.) (If yes, when and what did the pharmacy advise?)     Preferred Pharmacy (with phone number or street name):  [id number] [place], [place] - [address]  [address] [place] [address]  Phone:  [phone]  Fax:  [phone]     Agent: Please be advised that RX refills may take up to 3 business days. We ask that you follow-up with your pharmacy.

## 2021-01-07 ENCOUNTER — Other Ambulatory Visit: Payer: Self-pay

## 2021-01-07 DIAGNOSIS — F418 Other specified anxiety disorders: Secondary | ICD-10-CM

## 2021-01-07 MED ORDER — FLUOXETINE HCL 10 MG PO CAPS: 10.0000 mg | ORAL_CAPSULE | Freq: Every day | ORAL | 3 refills | Status: DC

## 2021-01-07 NOTE — Telephone Encounter (Signed)
Rx sent in

## 2021-01-07 NOTE — Telephone Encounter (Signed)
Patient is requesting call back at [phone].  Would like to know if prozac is going to get filled.

## 2021-01-13 ENCOUNTER — Encounter: Payer: BLUE CROSS/BLUE SHIELD | Admitting: Physician Assistant

## 2021-01-19 ENCOUNTER — Other Ambulatory Visit: Payer: Self-pay

## 2021-01-19 ENCOUNTER — Encounter: Payer: Self-pay | Admitting: Physician Assistant

## 2021-01-19 ENCOUNTER — Other Ambulatory Visit: Payer: 59

## 2021-01-19 ENCOUNTER — Ambulatory Visit (INDEPENDENT_AMBULATORY_CARE_PROVIDER_SITE_OTHER): Payer: 59 | Admitting: Physician Assistant

## 2021-01-19 VITALS — BP 160/110 | HR 91 | Temp 97.3°F | Ht 70.0 in | Wt 339.0 lb

## 2021-01-19 DIAGNOSIS — Z131 Encounter for screening for diabetes mellitus: Secondary | ICD-10-CM

## 2021-01-19 DIAGNOSIS — Z0000 Encounter for general adult medical examination without abnormal findings: Secondary | ICD-10-CM

## 2021-01-19 DIAGNOSIS — Z13220 Encounter for screening for lipoid disorders: Secondary | ICD-10-CM

## 2021-01-19 DIAGNOSIS — I10 Essential (primary) hypertension: Secondary | ICD-10-CM | POA: Diagnosis not present

## 2021-01-19 LAB — LIPID PANEL
Cholesterol: 152 mg/dL (ref 0–200)
HDL: 25.9 mg/dL — ABNORMAL LOW (ref >39.00)
NonHDL: 125.7
Total CHOL/HDL Ratio: 6
Triglycerides: 283 mg/dL — ABNORMAL HIGH (ref 0.0–149.0)
VLDL: 56.6 mg/dL — ABNORMAL HIGH (ref 0.0–40.0)

## 2021-01-19 LAB — CBC WITH DIFFERENTIAL/PLATELET
Basophils Absolute: 0.1 10*3/uL (ref 0.0–0.1)
Basophils Relative: 0.9 % (ref 0.0–3.0)
Eosinophils Absolute: 0.2 10*3/uL (ref 0.0–0.7)
Eosinophils Relative: 2.4 % (ref 0.0–5.0)
HCT: 46.2 % (ref 39.0–52.0)
Hemoglobin: 16.2 g/dL (ref 13.0–17.0)
Lymphocytes Relative: 44 % (ref 12.0–46.0)
Lymphs Abs: 3.5 10*3/uL (ref 0.7–4.0)
MCHC: 35.1 g/dL (ref 30.0–36.0)
MCV: 81.4 fl (ref 78.0–100.0)
Monocytes Absolute: 0.5 10*3/uL (ref 0.1–1.0)
Monocytes Relative: 6.5 % (ref 3.0–12.0)
Neutro Abs: 3.7 10*3/uL (ref 1.4–7.7)
Neutrophils Relative %: 46.2 % (ref 43.0–77.0)
Platelets: 329 10*3/uL (ref 150.0–400.0)
RBC: 5.67 Mil/uL (ref 4.22–5.81)
RDW: 14.8 % (ref 11.5–15.5)
WBC: 8 10*3/uL (ref 4.0–10.5)

## 2021-01-19 LAB — COMPREHENSIVE METABOLIC PANEL
ALT: 71 U/L — ABNORMAL HIGH (ref 0–53)
AST: 40 U/L — ABNORMAL HIGH (ref 0–37)
Albumin: 4 g/dL (ref 3.5–5.2)
Alkaline Phosphatase: 73 U/L (ref 39–117)
BUN: 10 mg/dL (ref 6–23)
CO2: 29 mEq/L (ref 19–32)
Calcium: 9.4 mg/dL (ref 8.4–10.5)
Chloride: 93 mEq/L — ABNORMAL LOW (ref 96–112)
Creatinine, Ser: 0.77 mg/dL (ref 0.40–1.50)
GFR: 116.78 mL/min (ref >60.00)
Glucose, Bld: 329 mg/dL — ABNORMAL HIGH (ref 70–99)
Potassium: 4.3 mEq/L (ref 3.5–5.1)
Sodium: 133 mEq/L — ABNORMAL LOW (ref 135–145)
Total Bilirubin: 0.6 mg/dL (ref 0.2–1.2)
Total Protein: 7.3 g/dL (ref 6.0–8.3)

## 2021-01-19 LAB — TSH: TSH: 3.72 u[IU]/mL (ref 0.35–5.50)

## 2021-01-19 LAB — LDL CHOLESTEROL, DIRECT: Direct LDL: 75 mg/dL

## 2021-01-19 NOTE — Progress Notes (Signed)
Subjective:    Patient ID: Raymond Costa, male    DOB: 01-Dec-1985, 36 y.o.   MRN: [id number]  Chief Complaint  Patient presents with   Annual Exam    HPI Patient is in today for annual. Married, no kids, currently trying and considering fertility.   Acute concerns: Worried about possibly having diabetes. Mother was diagnosed around the age of 16. Pt states that he has been having dry mouth and feeling the need to drink a lot.   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 Eats out often. Really enjoys vegetables. Sweets are the weakness.  Mental health: Prozac has significantly reduced his panic attacks, used to have them a lot when he was driving or by himself. Uses meditation to help as well. Best friend since elem school passed away before Christmas of brain cancer, which was very difficult for him.  Caffeine: No sodas Sleep: Doing well; states he did an in-home test a few years ago and sleep study was ok; no longer uses CPAP (he was diagnosed in his teen years) Substance use: None ETOH: Very rarely Sexual activity: Monogamous with wife Immunizations: UTD Colonoscopy: No family hx, will do this at age 65; no concerns per patient   Past Medical History:  Diagnosis Date   Anxiety    GERD (gastroesophageal reflux disease)    Hypertension    Obesity    Sleep apnea    Not on cpap machine     Past Surgical History:  Procedure Laterality Date   arm fracture surgery Left    36 years old     Family History  Problem Relation Age of Onset   Hypertension Mother    Diabetes Mother    Cancer Father        leukemia   Prostate cancer Maternal Grandfather    Colon cancer Neg Hx    Esophageal cancer Neg Hx     Social History   Tobacco Use   Smoking status: Never   Smokeless tobacco: Never  Vaping Use   Vaping Use: Never used   Substance Use Topics   Alcohol use: Yes    Comment: ocassionally   Drug use: Never     Allergies  Allergen Reactions   Shellfish Allergy Hives    Review of Systems NEGATIVE UNLESS OTHERWISE INDICATED IN HPI      Objective:     BP (!) 160/110 (BP Location: Left Arm, Patient Position: Sitting, Cuff Size: Large)    Pulse 91    Temp (!) 97.3 F (36.3 C) (Temporal)    Ht 5\' 10"  (1.778 m)    Wt (!) 339 lb (153.8 kg)    SpO2 98%    BMI 48.64 kg/m   Wt Readings from Last 3 Encounters:  01/19/21 (!) 339 lb (153.8 kg)  09/04/20 (!) 343 lb 6.4 oz (155.8 kg)  04/01/20 (!) 339 lb 3.2 oz (153.9 kg)    BP Readings from Last 3 Encounters:  01/19/21 (!) 160/110  09/04/20 134/82  04/01/20 (!) 155/109     Physical Exam Vitals and nursing note reviewed.  Constitutional:      General: He is not in acute distress.    Appearance: Normal appearance. He is obese. He is not toxic-appearing.  HENT:     Head: Normocephalic and atraumatic.     Right Ear: Tympanic membrane, ear canal and external ear normal.  Left Ear: Tympanic membrane, ear canal and external ear normal.     Nose: Nose normal.     Mouth/Throat:     Mouth: Mucous membranes are moist.     Pharynx: Oropharynx is clear.  Eyes:     Extraocular Movements: Extraocular movements intact.     Conjunctiva/sclera: Conjunctivae normal.     Pupils: Pupils are equal, round, and reactive to light.  Cardiovascular:     Rate and Rhythm: Normal rate and regular rhythm.     Pulses: Normal pulses.     Heart sounds: Normal heart sounds.  Pulmonary:     Effort: Pulmonary effort is normal.     Breath sounds: Normal breath sounds.  Abdominal:     General: Abdomen is flat. Bowel sounds are normal.     Palpations: Abdomen is soft.     Tenderness: There is no abdominal tenderness.  Musculoskeletal:        General: Normal range of motion.     Cervical back: Normal range of motion and neck supple.  Skin:    General: Skin is warm and  dry.  Neurological:     General: No focal deficit present.     Mental Status: He is alert and oriented to person, place, and time.  Psychiatric:        Mood and Affect: Mood normal.        Behavior: Behavior normal.       Assessment & Plan:   Problem List Items Addressed This Visit       Cardiovascular and Mediastinum   Essential hypertension   Relevant Orders   CBC with Differential/Platelet   Comprehensive metabolic panel   Lipid panel   Other Visit Diagnoses     Encounter for annual physical exam    -  Primary   Relevant Orders   CBC with Differential/Platelet   Comprehensive metabolic panel   Lipid panel   Hemoglobin A1c   TSH   Diabetes mellitus screening       Relevant Orders   Comprehensive metabolic panel   Hemoglobin A1c   Screening for cholesterol level       Relevant Orders   Lipid panel       Plan: -Age-appropriate screening and counseling performed today. Will check labs and call with results. Preventive measures discussed and printed in AVS for patient.  -Declines flu vaccine  -BP is elevated, but this is also driven by his anxiety about having his BP taken. Recommend he monitor at home and bring readings to next appt in about 3 months. He is asymptomatic. -He really needs to work on weight loss and he acknowledges this as well. Encouraged him to move more, eat less fast food and sweets. -Again, plan to f/up in 3 months or sooner if any issues.     Celia Gibbons M Montanna Mcbain, PA-C

## 2021-01-19 NOTE — Patient Instructions (Addendum)
Always good to see you! Please go to the lab for blood work and I will send results through MyChart. Set small goals for yourself over the next few months - more movement, less sweet. Keep working toward weight loss goals.   Monitor your blood pressure at home and bring readings to next appointment. Less salt / processed foods will help this also.

## 2021-01-20 ENCOUNTER — Encounter: Payer: Self-pay | Admitting: Physician Assistant

## 2021-01-20 LAB — HEMOGLOBIN A1C
Hgb A1c MFr Bld: 12.9 % of total Hgb — ABNORMAL HIGH (ref <5.7)
Mean Plasma Glucose: 324 mg/dL
eAG (mmol/L): 17.9 mmol/L

## 2021-01-22 ENCOUNTER — Other Ambulatory Visit: Payer: Self-pay

## 2021-01-22 ENCOUNTER — Ambulatory Visit: Payer: 59 | Admitting: Physician Assistant

## 2021-01-22 VITALS — BP 165/118 | HR 78 | Temp 98.0°F | Ht 70.0 in | Wt 338.4 lb

## 2021-01-22 DIAGNOSIS — I10 Essential (primary) hypertension: Secondary | ICD-10-CM | POA: Diagnosis not present

## 2021-01-22 DIAGNOSIS — E119 Type 2 diabetes mellitus without complications: Secondary | ICD-10-CM | POA: Diagnosis not present

## 2021-01-22 MED ORDER — METFORMIN HCL ER 500 MG PO TB24: 1000.0000 mg | ORAL_TABLET | Freq: Two times a day (BID) | ORAL | 0 refills | Status: DC

## 2021-01-22 MED ORDER — AMLODIPINE BESYLATE 2.5 MG PO TABS: 2.5000 mg | ORAL_TABLET | Freq: Every day | ORAL | 0 refills | Status: DC

## 2021-01-22 NOTE — Patient Instructions (Addendum)
High blood pressure: Start taking Amlodipine 2.5 mg daily with your Hyzaar Monitor your readings at home and bring log to next appointment.    Metformin Titration: Start Metformin 1 tablet daily with Breakfast for 1 week, then increase to 1 tablet with Breakfast and 1 tablet with Supper for  1 week, then increase to 2 tablets with Breakfast  and 1 tablet with Supper for another 1 week , then finally 2 Tablets with Breakfast and 2 tablets with Supper.    Food Considerations: *Someone will reach out to schedule about nutritionist.  Please consider the following ways to cut down carbs and fat and increase fiber and micronutrients in your diet:  - substitute whole grain for white bread or pasta - substitute brown rice for white rice - substitute 90-calorie flatbread pieces for slices of bread when possible - substitute sweet potatoes or yams for white potatoes - substitute humus for margarine - substitute tofu for cheese when possible - substitute almond or rice milk for regular milk - substitute dark chocolate for other sweets when possible - substitute water - can add lemon/orange/lime/kiwi slices for taste - for diet sodas (artificial sweeteners will trick your body that you can eat sweets without getting calories and will lead you to overeating and weight gain in the long run) - do not skip breakfast or other meals (this will slow down the metabolism and will result in more weight gain over time)  - can try smoothies made from fruit and almond/rice milk in am instead of regular breakfast - can also try old-fashioned (not instant) oatmeal made with almond/rice milk in am - order the dressing on the side when eating salad at a restaurant (pour less than half of the dressing on the salad) - eat as little meat as possible  - can try juicing, but should not forget that juicing will get rid of the fiber, so would alternate with eating raw veg./fruits or drinking smoothies - use as little oil  as possible, even when using olive oil - can dress a salad with a mix of balsamic vinegar and lemon juice, for e.g. - use agave nectar, stevia sugar, or regular sugar rather than artificial sweateners - steam or broil/roast veggies  - snack on veggies/fruit/nuts (unsalted, preferably) when possible, rather than processed foods - reduce or eliminate aspartame in diet (it is in diet sodas, chewing gum, etc) Read the labels!  Try to read Dr. Katherina Right book: "Program for Reversing Diabetes" for the vegan concept and other ideas for healthy eating.

## 2021-01-22 NOTE — Progress Notes (Signed)
Subjective:    Patient ID: Raymond Costa, male    DOB: [birth year], 36 y.o.   MRN: [id number]  Chief Complaint  Patient presents with   Diabetes    HPI Patient is in today for new onset diabetes - labs from 01/19/21. Here for discussion about plan moving forward. Denies any symptoms at this time. Significant family hx of diabetes.   Lab Results  Component Value Date   HGBA1C 12.9 (H) 01/19/2021    Past Medical History:  Diagnosis Date   Anxiety    GERD (gastroesophageal reflux disease)    Hypertension    Obesity    Sleep apnea    Not on cpap machine     Past Surgical History:  Procedure Laterality Date   arm fracture surgery Left    36 years old     Family History  Problem Relation Age of Onset   Hypertension Mother    Diabetes Mother    Cancer Father        leukemia   Prostate cancer Maternal Grandfather    Colon cancer Neg Hx    Esophageal cancer Neg Hx     Social History   Tobacco Use   Smoking status: Never   Smokeless tobacco: Never  Vaping Use   Vaping Use: Never used  Substance Use Topics   Alcohol use: Yes    Comment: ocassionally   Drug use: Never     Allergies  Allergen Reactions   Shellfish Allergy Hives    Review of Systems NEGATIVE UNLESS OTHERWISE INDICATED IN HPI      Objective:     BP (!) 165/118    Pulse 78    Temp 98 F (36.7 C)    Ht 5\' 10"  (1.778 m)    Wt (!) 338 lb 6.1 oz (153.5 kg)    SpO2 100%    BMI 48.55 kg/m   Wt Readings from Last 3 Encounters:  01/22/21 (!) 338 lb 6.1 oz (153.5 kg)  01/19/21 (!) 339 lb (153.8 kg)  09/04/20 (!) 343 lb 6.4 oz (155.8 kg)    BP Readings from Last 3 Encounters:  01/22/21 (!) 165/118  01/19/21 (!) 160/110  09/04/20 134/82     Physical Exam Vitals and nursing note reviewed.  Constitutional:      General: He is not in acute distress.    Appearance: Normal appearance. He is obese. He is not toxic-appearing.  HENT:     Head: Normocephalic and atraumatic.     Right Ear:  Tympanic membrane, ear canal and external ear normal.     Left Ear: Tympanic membrane, ear canal and external ear normal.     Nose: Nose normal.     Mouth/Throat:     Mouth: Mucous membranes are moist.     Pharynx: Oropharynx is clear.  Eyes:     Extraocular Movements: Extraocular movements intact.     Conjunctiva/sclera: Conjunctivae normal.     Pupils: Pupils are equal, round, and reactive to light.  Cardiovascular:     Rate and Rhythm: Normal rate and regular rhythm.     Pulses: Normal pulses.     Heart sounds: Normal heart sounds.  Pulmonary:     Effort: Pulmonary effort is normal.     Breath sounds: Normal breath sounds.  Abdominal:     General: Abdomen is flat. Bowel sounds are normal.     Palpations: Abdomen is soft.     Tenderness: There is no abdominal tenderness.  Musculoskeletal:  General: Normal range of motion.     Cervical back: Normal range of motion and neck supple.  Skin:    General: Skin is warm and dry.  Neurological:     General: No focal deficit present.     Mental Status: He is alert and oriented to person, place, and time.  Psychiatric:        Mood and Affect: Mood normal.     Comments: Anxious       Assessment & Plan:   Problem List Items Addressed This Visit       Cardiovascular and Mediastinum   Essential hypertension   Relevant Medications   amLODipine (NORVASC) 2.5 MG tablet   Other Visit Diagnoses     New onset type 2 diabetes mellitus (HCC)    -  Primary   Relevant Medications   metFORMIN (GLUCOPHAGE XR) 500 MG 24 hr tablet   Other Relevant Orders   Amb ref to Medical Nutrition Therapy-MNT        Meds ordered this encounter  Medications   metFORMIN (GLUCOPHAGE XR) 500 MG 24 hr tablet    Sig: Take 2 tablets (1,000 mg total) by mouth 2 (two) times daily.    Dispense:  120 tablet    Refill:  0   amLODipine (NORVASC) 2.5 MG tablet    Sig: Take 1 tablet (2.5 mg total) by mouth daily. To help with blood pressure control.     Dispense:  90 tablet    Refill:  0    1. New onset type 2 diabetes mellitus (HCC)  I have discussed with the patient the pathophysiology of diabetes. We went over the natural progression of the disease. We talked about both insulin resistance and insulin deficiency. We stressed the importance of lifestyle changes including diet and exercise. I explained the complications associated with diabetes including retinopathy, nephropathy, neuropathy as well as increased risk of cardiovascular disease. We went over the benefit seen with glycemic control.   I explained to the patient that diabetic patients are at higher than normal risk for amputations. The patient was informed that diabetes is the number one cause of non-traumatic amputations in [place]. The patient was advised to look and examine their feet , wear proper fitting shoes and not to go barefoot.   -Plan to start on Metformin. Possible SE discussed. Titration in AVS.  -Referral to nutritionist -encouraged to keep working toward LIFESTYLE goals, 150 min aerobic activity encouraged per week.   2. Essential hypertension -Very elevated today, I'm sure some of this is due to anxiety around new diagnosis, however he has been trending high for some time. -Plan to continue Hyzaar 100-25 mg. Will add amlodipine 2.5 mg daily as well. -Possible SE discussed. -Encouraged to track readings at home.  -Close f/up with me in 4 weeks.   This note was prepared with assistance of Conservation officer, historic buildings. Occasional wrong-word or sound-a-like substitutions may have occurred due to the inherent limitations of voice recognition software.  Time Spent: 34 minutes of total time was spent on the date of the encounter performing the following actions: chart review prior to seeing the patient, obtaining history, performing a medically necessary exam, counseling on the treatment plan, placing orders, and documenting in our EHR.    Avry Roedl M Arrie Zuercher,  PA-C

## 2021-02-17 ENCOUNTER — Telehealth: Payer: Self-pay

## 2021-02-17 NOTE — Telephone Encounter (Signed)
Patient states he started taking metformin and amlodipine.  States has a rash that has appeared on his belly.    Patient has appt Thursday and can not come in sooner for eval.   Would like to know if this could possibly be a side effect?

## 2021-02-17 NOTE — Telephone Encounter (Signed)
Patient notified he voices understanding ° °

## 2021-02-19 ENCOUNTER — Other Ambulatory Visit: Payer: Self-pay

## 2021-02-19 ENCOUNTER — Ambulatory Visit: Payer: 59 | Admitting: Physician Assistant

## 2021-02-19 VITALS — BP 142/80 | HR 85 | Temp 98.0°F | Ht 70.0 in | Wt 343.2 lb

## 2021-02-19 DIAGNOSIS — I10 Essential (primary) hypertension: Secondary | ICD-10-CM

## 2021-02-19 DIAGNOSIS — L270 Generalized skin eruption due to drugs and medicaments taken internally: Secondary | ICD-10-CM | POA: Diagnosis not present

## 2021-02-19 DIAGNOSIS — E119 Type 2 diabetes mellitus without complications: Secondary | ICD-10-CM

## 2021-02-19 NOTE — Patient Instructions (Signed)
Great work with lifestyle changes already!  Keep off both medications at this time. Continue cocoa butter for rash as this seems to be helping. Start back on only amlodipine in about one week if rash is significantly better.   I'll see you back in about 4 weeks. We'll stay off diabetes medications for now to see if Metformin was culprit of rash.

## 2021-02-19 NOTE — Progress Notes (Signed)
Subjective:    Patient ID: Raymond Costa, male    DOB: 1985-06-25, 36 y.o.   MRN: [id number]  Chief Complaint  Patient presents with   Medication Problem    HPI Patient is in today for recheck on HTN and new T2DM. Also wants to discuss rash.   Started on amlodipine and metformin since last visit on 01/22/21. Developed a rash across his abdomen and on R upper arm. Tried to change detergents and soaps. Rash still present, but no longer itching. Stopped both of these medications two days ago & seems some improvement. Cocoa butter is helping.  Since last visit, pt and his wife have been working hard on lifestyle changes. Plant-based diet. Less snacking. Limiting eating in the evenings. He is also increasing his exercise during the week & wants to work on more cardio.  He says he is feeling better and has more energy and better sleep just in the few weeks of changing lifestyle habits.   Past Medical History:  Diagnosis Date   Anxiety    GERD (gastroesophageal reflux disease)    Hypertension    Obesity    Sleep apnea    Not on cpap machine     Past Surgical History:  Procedure Laterality Date   arm fracture surgery Left    36 years old     Family History  Problem Relation Age of Onset   Hypertension Mother    Diabetes Mother    Cancer Father        leukemia   Prostate cancer Maternal Grandfather    Colon cancer Neg Hx    Esophageal cancer Neg Hx     Social History   Tobacco Use   Smoking status: Never   Smokeless tobacco: Never  Vaping Use   Vaping Use: Never used  Substance Use Topics   Alcohol use: Yes    Comment: ocassionally   Drug use: Never     Allergies  Allergen Reactions   Metformin And Related Hives   Shellfish Allergy Hives    Review of Systems NEGATIVE UNLESS OTHERWISE INDICATED IN HPI      Objective:     BP (!) 142/80    Pulse 85    Temp 98 F (36.7 C)    Ht 5\' 10"  (1.778 m)    Wt (!) 343 lb 3.2 oz (155.7 kg)    SpO2 98%    BMI  49.24 kg/m   Wt Readings from Last 3 Encounters:  02/19/21 (!) 343 lb 3.2 oz (155.7 kg)  01/22/21 (!) 338 lb 6.1 oz (153.5 kg)  01/19/21 (!) 339 lb (153.8 kg)    BP Readings from Last 3 Encounters:  02/19/21 (!) 142/80  01/22/21 (!) 165/118  01/19/21 (!) 160/110     Physical Exam Vitals and nursing note reviewed.  Constitutional:      General: He is not in acute distress.    Appearance: Normal appearance. He is obese. He is not toxic-appearing.  HENT:     Head: Normocephalic and atraumatic.     Right Ear: External ear normal.     Left Ear: External ear normal.     Nose: Nose normal.     Mouth/Throat:     Mouth: Mucous membranes are moist.     Pharynx: Oropharynx is clear.  Eyes:     Extraocular Movements: Extraocular movements intact.     Conjunctiva/sclera: Conjunctivae normal.     Pupils: Pupils are equal, round, and reactive to light.  Cardiovascular:  Rate and Rhythm: Normal rate and regular rhythm.     Pulses: Normal pulses.     Heart sounds: Normal heart sounds.  Pulmonary:     Effort: Pulmonary effort is normal.     Breath sounds: Normal breath sounds.  Musculoskeletal:        General: Normal range of motion.     Cervical back: Normal range of motion and neck supple.  Skin:    General: Skin is warm and dry.     Findings: Rash (fine dermatitis-type rash upper abdomen and R shoulder) present.  Neurological:     General: No focal deficit present.     Mental Status: He is alert and oriented to person, place, and time.  Psychiatric:        Mood and Affect: Mood normal.        Behavior: Behavior normal.       Assessment & Plan:   Problem List Items Addressed This Visit       Cardiovascular and Mediastinum   Essential hypertension   Other Visit Diagnoses     Drug-induced skin rash    -  Primary   New onset type 2 diabetes mellitus (HCC)           Plan: Great work with lifestyle changes already! Blood pressure already better!  Keep off both  medications at this time. Continue cocoa butter for rash as this seems to be helping. Start back on only amlodipine in about one week if rash is significantly better.   I'll see you back in about 4 weeks. We'll stay off diabetes medications for now to see if Metformin was culprit of rash.    Keyosha Tiedt M Darrie Macmillan, PA-C

## 2021-03-19 ENCOUNTER — Encounter: Payer: Self-pay | Admitting: Physician Assistant

## 2021-03-19 ENCOUNTER — Ambulatory Visit: Payer: 59 | Admitting: Physician Assistant

## 2021-03-19 VITALS — BP 163/100 | HR 101 | Temp 98.0°F | Ht 71.0 in | Wt 346.0 lb

## 2021-03-19 DIAGNOSIS — I10 Essential (primary) hypertension: Secondary | ICD-10-CM

## 2021-03-19 DIAGNOSIS — E1165 Type 2 diabetes mellitus with hyperglycemia: Secondary | ICD-10-CM | POA: Diagnosis not present

## 2021-03-19 MED ORDER — AMLODIPINE BESYLATE 5 MG PO TABS: 5.0000 mg | ORAL_TABLET | Freq: Every day | ORAL | 2 refills | Status: DC

## 2021-03-19 NOTE — Patient Instructions (Signed)
Increase amlodipine to 5 mg daily ? ?Start back on one metformin with evening meal. ? ?Let me know in the next week how you are doing with metformin.  ?

## 2021-03-19 NOTE — Progress Notes (Signed)
? ?Subjective:  ? ? Patient ID: Raymond Costa, male    DOB: 1985-11-12, 36 y.o.   MRN: [id number] ? ?Chief Complaint  ?Patient presents with  ? Follow-up  ? ? ?HPI ?Patient is in today for follow up of hypertension and type 2 diabetes. ? ?Started back on Amlodipine two weeks ago.  He continues to take losartan hydrochlorothiazide 100-25 mg daily.  States that he is feeling anxious today, but says that his blood pressure has been running in the 140s over 90s at home.  He is feeling disappointed today because of his weight gain. ? ?Works 12 pm - 9 pm daily M-F. Tries to walk on lunch breaks. Has a new puppy which is helping him to walk as well. Still working on nutrition and being mindful to choose more plants. Still has occasional chicken wings / popcorn, etc. Cut out all juices and sodas. Still drinking sweet tea and lemonade.  ? ?Not taking any medications for diabetes at this time.  States that he is willing to start back on metformin and give it another try. ? ? ? ?Past Medical History:  ?Diagnosis Date  ? Anxiety   ? GERD (gastroesophageal reflux disease)   ? Hypertension   ? Obesity   ? Sleep apnea   ? Not on cpap machine   ? ? ?Past Surgical History:  ?Procedure Laterality Date  ? arm fracture surgery Left   ? 36 years old   ? ? ?Family History  ?Problem Relation Age of Onset  ? Hypertension Mother   ? Diabetes Mother   ? Cancer Father   ?     leukemia  ? Prostate cancer Maternal Grandfather   ? Colon cancer Neg Hx   ? Esophageal cancer Neg Hx   ? ? ?Social History  ? ?Tobacco Use  ? Smoking status: Never  ? Smokeless tobacco: Never  ?Vaping Use  ? Vaping Use: Never used  ?Substance Use Topics  ? Alcohol use: Yes  ?  Comment: ocassionally  ? Drug use: Never  ?  ? ?Allergies  ?Allergen Reactions  ? Metformin And Related Hives  ? Shellfish Allergy Hives  ? ? ?Review of Systems ?NEGATIVE UNLESS OTHERWISE INDICATED IN HPI ? ? ?   ?Objective:  ?  ? ?BP (!) 163/100   Pulse (!) 101   Temp 98 ?F (36.7 ?C)   Ht 5'  11" (1.803 m)   Wt (!) 346 lb (156.9 kg)   SpO2 97%   BMI 48.26 kg/m?  ? ?Wt Readings from Last 3 Encounters:  ?03/19/21 (!) 346 lb (156.9 kg)  ?02/19/21 (!) 343 lb 3.2 oz (155.7 kg)  ?01/22/21 (!) 338 lb 6.1 oz (153.5 kg)  ? ? ?BP Readings from Last 3 Encounters:  ?03/19/21 (!) 163/100  ?02/19/21 (!) 142/80  ?01/22/21 (!) 165/118  ?  ? ?Physical Exam ?Vitals and nursing note reviewed.  ?Constitutional:   ?   General: He is not in acute distress. ?   Appearance: Normal appearance. He is obese. He is not toxic-appearing.  ?HENT:  ?   Head: Normocephalic and atraumatic.  ?   Right Ear: Tympanic membrane, ear canal and external ear normal.  ?   Left Ear: Tympanic membrane, ear canal and external ear normal.  ?   Nose: Nose normal.  ?   Mouth/Throat:  ?   Mouth: Mucous membranes are moist.  ?   Pharynx: Oropharynx is clear.  ?Eyes:  ?   Extraocular Movements: Extraocular movements  intact.  ?   Conjunctiva/sclera: Conjunctivae normal.  ?   Pupils: Pupils are equal, round, and reactive to light.  ?Cardiovascular:  ?   Rate and Rhythm: Normal rate and regular rhythm.  ?   Pulses: Normal pulses.  ?   Heart sounds: Normal heart sounds.  ?Pulmonary:  ?   Effort: Pulmonary effort is normal.  ?   Breath sounds: Normal breath sounds.  ?Abdominal:  ?   General: Abdomen is flat. Bowel sounds are normal.  ?   Palpations: Abdomen is soft.  ?   Tenderness: There is no abdominal tenderness.  ?Musculoskeletal:     ?   General: Normal range of motion.  ?   Cervical back: Normal range of motion and neck supple.  ?Skin: ?   General: Skin is warm and dry.  ?Neurological:  ?   General: No focal deficit present.  ?   Mental Status: He is alert and oriented to person, place, and time.  ?Psychiatric:     ?   Mood and Affect: Mood normal.  ?   Comments: Anxious  ? ? ?   ?Assessment & Plan:  ? ?Problem List Items Addressed This Visit   ? ?  ? Cardiovascular and Mediastinum  ? Essential hypertension - Primary  ? Relevant Medications  ?  amLODipine (NORVASC) 2.5 MG tablet  ? amLODipine (NORVASC) 5 MG tablet  ? ?Other Visit Diagnoses   ? ? Type 2 diabetes mellitus with hyperglycemia, without long-term current use of insulin (HCC)      ? ?  ? ? ? ?Meds ordered this encounter  ?Medications  ? amLODipine (NORVASC) 5 MG tablet  ?  Sig: Take 1 tablet (5 mg total) by mouth daily.  ?  Dispense:  30 tablet  ?  Refill:  2  ? ?1. Essential hypertension ?Blood pressure is still not to goal.  He will continue on Hyzaar as he has been taking.  Plan to increase the Norvasc to 5 mg and see if he has a better response with this.  He needs to continue monitoring his blood pressure at home and again strongly emphasized increasing cardiovascular exercise. ? ?2. Type 2 diabetes mellitus with hyperglycemia, without long-term current use of insulin (HCC) ?Lab Results  ?Component Value Date  ? HGBA1C 12.9 (H) 01/19/2021  ? ?Patient's rash from last visit has resolved.  He is willing to give metformin another chance and we will start back on Metformin 500 mg one tab in the evenings with food. He needs to let me know in the next week how he is feeling and if he has any rash develop again, will need to officially be done with metformin and start on other options for glucose control. ?-Also talked with patient again about healthy snack options and avoiding eating late into the evening. ? ? ?This note was prepared with assistance of Conservation officer, historic buildings. Occasional wrong-word or sound-a-like substitutions may have occurred due to the inherent limitations of voice recognition software. ? ? ?Wilmar Prabhakar M Emilyn Ruble, PA-C ?

## 2021-03-20 ENCOUNTER — Telehealth: Payer: Self-pay | Admitting: Physician Assistant

## 2021-03-20 ENCOUNTER — Encounter: Payer: Self-pay | Admitting: Physician Assistant

## 2021-03-20 ENCOUNTER — Other Ambulatory Visit: Payer: Self-pay | Admitting: Physician Assistant

## 2021-03-20 MED ORDER — GLIPIZIDE 5 MG PO TABS: 5.0000 mg | ORAL_TABLET | Freq: Two times a day (BID) | ORAL | 3 refills | Status: DC

## 2021-03-20 NOTE — Telephone Encounter (Signed)
Pt was asked to call office regarding medication update- unable to fully understand patients message. Bad line signal .-  ?

## 2021-03-23 NOTE — Telephone Encounter (Signed)
Patient was informed to stop taking Metformin per Alyssa he voices understanding. ?

## 2021-04-02 ENCOUNTER — Other Ambulatory Visit: Payer: Self-pay | Admitting: Physician Assistant

## 2021-04-02 DIAGNOSIS — I10 Essential (primary) hypertension: Secondary | ICD-10-CM

## 2021-04-16 ENCOUNTER — Ambulatory Visit: Payer: 59 | Admitting: Physician Assistant

## 2021-04-16 ENCOUNTER — Encounter: Payer: Self-pay | Admitting: Physician Assistant

## 2021-04-16 VITALS — BP 150/90 | Temp 98.0°F | Wt 344.0 lb

## 2021-04-16 DIAGNOSIS — E1165 Type 2 diabetes mellitus with hyperglycemia: Secondary | ICD-10-CM

## 2021-04-16 DIAGNOSIS — Z6841 Body Mass Index (BMI) 40.0 and over, adult: Secondary | ICD-10-CM

## 2021-04-16 DIAGNOSIS — I10 Essential (primary) hypertension: Secondary | ICD-10-CM | POA: Diagnosis not present

## 2021-04-16 DIAGNOSIS — F418 Other specified anxiety disorders: Secondary | ICD-10-CM | POA: Diagnosis not present

## 2021-04-16 LAB — COMPREHENSIVE METABOLIC PANEL
ALT: 53 U/L (ref 0–53)
AST: 38 U/L — ABNORMAL HIGH (ref 0–37)
Albumin: 4.2 g/dL (ref 3.5–5.2)
Alkaline Phosphatase: 60 U/L (ref 39–117)
BUN: 12 mg/dL (ref 6–23)
CO2: 29 mEq/L (ref 19–32)
Calcium: 9.3 mg/dL (ref 8.4–10.5)
Chloride: 98 mEq/L (ref 96–112)
Creatinine, Ser: 0.9 mg/dL (ref 0.40–1.50)
GFR: 111.22 mL/min (ref >60.00)
Glucose, Bld: 196 mg/dL — ABNORMAL HIGH (ref 70–99)
Potassium: 3.7 mEq/L (ref 3.5–5.1)
Sodium: 136 mEq/L (ref 135–145)
Total Bilirubin: 0.8 mg/dL (ref 0.2–1.2)
Total Protein: 7.2 g/dL (ref 6.0–8.3)

## 2021-04-16 LAB — HEMOGLOBIN A1C: Hgb A1c MFr Bld: 9.5 % — ABNORMAL HIGH (ref 4.6–6.5)

## 2021-04-16 MED ORDER — AMLODIPINE BESYLATE 5 MG PO TABS: 5.0000 mg | ORAL_TABLET | Freq: Every day | ORAL | 0 refills | Status: DC

## 2021-04-16 MED ORDER — FLUOXETINE HCL 10 MG PO CAPS: 10.0000 mg | ORAL_CAPSULE | Freq: Every day | ORAL | 3 refills | Status: DC

## 2021-04-16 MED ORDER — LOSARTAN POTASSIUM-HCTZ 100-25 MG PO TABS: 1.0000 | ORAL_TABLET | Freq: Every day | ORAL | 0 refills | Status: DC

## 2021-04-16 NOTE — Progress Notes (Signed)
? ?Subjective:  ? ? Patient ID: Raymond Costa, male    DOB: [birth year], 36 y.o.   MRN: [id number] ? ?Chief Complaint  ?Patient presents with  ? Follow-up  ? ? ?HPI ?Patient is in today for f/up on T2DM, obesity, HTN, anxiety. He is fasting today. Feels nervous about his labs today.  ? ?Enjoying time with his new puppy. MGF just celebrated 90th birthday.  ? ?Hasn't started on glipizide yet due to family and friends talking about hypoglycemia. He has been limiting his sweets, cut out juices and bowls of cereals.  ? ?He is taking his blood pressure medications. Trying to walk daily & going to the gym intermittently.  ? ?Still taking Prozac 10 mg daily. Needs to get back with counselor. Most anxious about health issues. Weighs himself daily. Goal is to get to 300 lbs by his birthday this year.  ? ?Past Medical History:  ?Diagnosis Date  ? Anxiety   ? GERD (gastroesophageal reflux disease)   ? Hypertension   ? Obesity   ? Sleep apnea   ? Not on cpap machine   ? ? ?Past Surgical History:  ?Procedure Laterality Date  ? arm fracture surgery Left   ? 36 years old   ? ? ?Family History  ?Problem Relation Age of Onset  ? Hypertension Mother   ? Diabetes Mother   ? Cancer Father   ?     leukemia  ? Prostate cancer Maternal Grandfather   ? Colon cancer Neg Hx   ? Esophageal cancer Neg Hx   ? ? ?Social History  ? ?Tobacco Use  ? Smoking status: Never  ? Smokeless tobacco: Never  ?Vaping Use  ? Vaping Use: Never used  ?Substance Use Topics  ? Alcohol use: Yes  ?  Comment: ocassionally  ? Drug use: Never  ?  ? ?Allergies  ?Allergen Reactions  ? Metformin And Related Hives  ? Shellfish Allergy Hives  ? ? ?Review of Systems ?NEGATIVE UNLESS OTHERWISE INDICATED IN HPI ? ? ?   ?Objective:  ?  ? ?BP (!) 150/90   Temp 98 ?F (36.7 ?C)   Wt (!) 344 lb (156 kg)   BMI 47.98 kg/m?  ? ?Wt Readings from Last 3 Encounters:  ?04/16/21 (!) 344 lb (156 kg)  ?03/19/21 (!) 346 lb (156.9 kg)  ?02/19/21 (!) 343 lb 3.2 oz (155.7 kg)  ? ? ?BP  Readings from Last 3 Encounters:  ?04/16/21 (!) 150/90  ?03/19/21 (!) 163/100  ?02/19/21 (!) 142/80  ?  ? ?Physical Exam ?Vitals and nursing note reviewed.  ?Constitutional:   ?   General: He is not in acute distress. ?   Appearance: Normal appearance. He is obese. He is not toxic-appearing.  ?HENT:  ?   Head: Normocephalic and atraumatic.  ?   Right Ear: Tympanic membrane, ear canal and external ear normal.  ?   Left Ear: Tympanic membrane, ear canal and external ear normal.  ?   Nose: Nose normal.  ?   Mouth/Throat:  ?   Mouth: Mucous membranes are moist.  ?   Pharynx: Oropharynx is clear.  ?Eyes:  ?   Extraocular Movements: Extraocular movements intact.  ?   Conjunctiva/sclera: Conjunctivae normal.  ?   Pupils: Pupils are equal, round, and reactive to light.  ?Cardiovascular:  ?   Rate and Rhythm: Normal rate and regular rhythm.  ?   Pulses: Normal pulses.  ?   Heart sounds: Normal heart sounds.  ?Pulmonary:  ?  Effort: Pulmonary effort is normal.  ?   Breath sounds: Normal breath sounds.  ?Abdominal:  ?   General: Abdomen is flat. Bowel sounds are normal.  ?   Palpations: Abdomen is soft.  ?   Tenderness: There is no abdominal tenderness.  ?Musculoskeletal:     ?   General: Normal range of motion.  ?   Cervical back: Normal range of motion and neck supple.  ?Skin: ?   General: Skin is warm and dry.  ?Neurological:  ?   General: No focal deficit present.  ?   Mental Status: He is alert and oriented to person, place, and time.  ?Psychiatric:     ?   Mood and Affect: Mood normal.  ?   Comments: Anxious  ? ? ?   ?Assessment & Plan:  ? ?Problem List Items Addressed This Visit   ? ?  ? Cardiovascular and Mediastinum  ? Essential hypertension - Primary  ? Relevant Medications  ? losartan-hydrochlorothiazide (HYZAAR) 100-25 MG tablet  ? amLODipine (NORVASC) 5 MG tablet  ? Other Relevant Orders  ? Comprehensive metabolic panel (Completed)  ?  ? Endocrine  ? Type 2 diabetes mellitus with hyperglycemia, without long-term  current use of insulin (HCC)  ? Relevant Medications  ? losartan-hydrochlorothiazide (HYZAAR) 100-25 MG tablet  ? Other Relevant Orders  ? Comprehensive metabolic panel (Completed)  ? Hemoglobin A1c (Completed)  ?  ? Other  ? Anxiety about health  ? Relevant Medications  ? FLUoxetine (PROZAC) 10 MG capsule  ? ?Other Visit Diagnoses   ? ? Morbid obesity with BMI of 45.0-49.9, adult (HCC)      ? ?  ? ? ? ?Meds ordered this encounter  ?Medications  ? losartan-hydrochlorothiazide (HYZAAR) 100-25 MG tablet  ?  Sig: Take 1 tablet by mouth daily.  ?  Dispense:  90 tablet  ?  Refill:  0  ? FLUoxetine (PROZAC) 10 MG capsule  ?  Sig: Take 1 capsule (10 mg total) by mouth daily.  ?  Dispense:  90 capsule  ?  Refill:  3  ? amLODipine (NORVASC) 5 MG tablet  ?  Sig: Take 1 tablet (5 mg total) by mouth daily.  ?  Dispense:  90 tablet  ?  Refill:  0  ? ?1. Essential hypertension ?Not to goal ??WCS playing a role ?Needs to monitor at home ?Cont Hyzaar 100-25 mg  ?Amlodipine 5 mg ? ?2. Type 2 diabetes mellitus with hyperglycemia, without long-term current use of insulin (HCC) ?Recheck CMP and Ha1c today ?Did not start glipizide due to concerns about possible SE ?Talked about once weekly injectable options ? ?3. Anxiety about health ?Prozac 10 mg refilled today ?Consider counseling ? ?4. Morbid obesity with BMI of 45.0-49.9, adult (HCC) ?Keep working towards weight loss goals  ? ? ? ?Madix Blowe M Lenvil Swaim, PA-C ?

## 2021-04-16 NOTE — Patient Instructions (Signed)
Good to see you today! Happy Easter. ? ?Fasting labs this morning. We'll talk next week on the phone about results and next steps. ? ?Keep working hard! ? ?Consider options like Mounjaro, Trulicity, or Ozempic - look into these and ask insurance what they'd be willing to cover.  ?

## 2021-04-18 ENCOUNTER — Other Ambulatory Visit: Payer: Self-pay | Admitting: Physician Assistant

## 2021-04-28 ENCOUNTER — Telehealth: Payer: Self-pay | Admitting: Physician Assistant

## 2021-04-28 NOTE — Telephone Encounter (Signed)
Lab result message from Raymond Costa has been read by pt 04/27/2021. He has questions about starting "injectable". He prefers to take Glipizide first, he does not want to start an injectable before.  ?

## 2021-04-28 NOTE — Telephone Encounter (Signed)
Already addressed in phone note.  ?

## 2021-04-28 NOTE — Telephone Encounter (Signed)
I spoke with the pt to give message below. He voices understanding and was transferred to the front desk for scheduling. Pt was told to schedule 3 month f/u.  ?

## 2021-04-28 NOTE — Telephone Encounter (Signed)
Please call back, wondering about lab results. ?

## 2021-05-01 ENCOUNTER — Telehealth (INDEPENDENT_AMBULATORY_CARE_PROVIDER_SITE_OTHER): Payer: 59 | Admitting: Physician Assistant

## 2021-05-01 ENCOUNTER — Encounter: Payer: Self-pay | Admitting: Physician Assistant

## 2021-05-01 VITALS — Ht 71.0 in | Wt 343.9 lb

## 2021-05-01 DIAGNOSIS — E1165 Type 2 diabetes mellitus with hyperglycemia: Secondary | ICD-10-CM | POA: Diagnosis not present

## 2021-05-01 DIAGNOSIS — Z6841 Body Mass Index (BMI) 40.0 and over, adult: Secondary | ICD-10-CM

## 2021-05-01 MED ORDER — TIRZEPATIDE 2.5 MG/0.5ML ~~LOC~~ SOAJ: 2.5000 mg | SUBCUTANEOUS | 1 refills | Status: DC

## 2021-05-01 NOTE — Progress Notes (Signed)
? ?  Virtual Visit via Video Note ? ?I connected with  Raymond Costa  on 05/01/21 at 11:30 AM EDT by a video enabled telemedicine application and verified that I am speaking with the correct person using two identifiers. ? ?Location: ?Patient: home ?Provider: Nature conservation officer at [employer] ?Persons present: Patient and myself ?  ?I discussed the limitations of evaluation and management by telemedicine and the availability of in person appointments. The patient expressed understanding and agreed to proceed.  ? ?History of Present Illness: ? ?Very pleasant 36 year old African-American male with uncontrolled type 2 diabetes presents for virtual video visit to discuss options for treatment of his diabetes.  States that he is very up in the air and nervous about new medications.  He tried metformin twice, both times resulting in a diffuse rash. ?Took the Glipizide once & made him feel "wonky" & very nauseated. ?Patient states that his mom takes Trulicity and has had good results with this.  He is considering a once weekly injectable, but nervous about the needle and possible side effects. ? ?Observations/Objective: ? ? ?Gen: Awake, alert, no acute distress ?Resp: Breathing is even and non-labored ?Psych: calm/pleasant demeanor ?Neuro: Alert and Oriented x 3, + facial symmetry, speech is clear. ? ? ?Assessment and Plan: ? ?1. Type 2 diabetes mellitus with hyperglycemia, without long-term current use of insulin (HCC) ?Uncontrolled ?Allergic to metformin ?Failed glipizide due to nausea ?I think that a GLP-1 agonist could be very beneficial for him at this point. ?Several options discussed. ?Plan to try to get started on Mounjaro 2.5 mg once weekly for 4 weeks. ?Possible side effects discussed with patient. ?He is going to send a MyChart message in the next few weeks with an update for me. ? ? ?2. Morbid obesity with BMI of 45.0-49.9, adult (HCC) ?Continue to work on healthy lifestyle changes.  Greggory Keen  should also help. ? ?Follow-up in person as scheduled. ? ? ?Follow Up Instructions: ? ?  ?I discussed the assessment and treatment plan with the patient. The patient was provided an opportunity to ask questions and all were answered. The patient agreed with the plan and demonstrated an understanding of the instructions. ?  ?The patient was advised to call back or seek an in-person evaluation if the symptoms worsen or if the condition fails to improve as anticipated. ? ?Nicasio Barlowe M Micaela Stith, PA-C ? ?

## 2021-05-05 ENCOUNTER — Telehealth: Payer: 59 | Admitting: Physician Assistant

## 2021-06-27 ENCOUNTER — Other Ambulatory Visit: Payer: Self-pay | Admitting: Physician Assistant

## 2021-06-27 DIAGNOSIS — E1165 Type 2 diabetes mellitus with hyperglycemia: Secondary | ICD-10-CM

## 2021-06-28 ENCOUNTER — Other Ambulatory Visit: Payer: Self-pay | Admitting: Physician Assistant

## 2021-06-28 DIAGNOSIS — I10 Essential (primary) hypertension: Secondary | ICD-10-CM

## 2021-07-16 ENCOUNTER — Encounter: Payer: Self-pay | Admitting: Physician Assistant

## 2021-07-16 ENCOUNTER — Ambulatory Visit: Payer: 59 | Admitting: Physician Assistant

## 2021-07-16 VITALS — BP 154/86 | HR 85 | Temp 97.8°F | Ht 71.0 in | Wt 361.0 lb

## 2021-07-16 DIAGNOSIS — E1165 Type 2 diabetes mellitus with hyperglycemia: Secondary | ICD-10-CM

## 2021-07-16 DIAGNOSIS — F418 Other specified anxiety disorders: Secondary | ICD-10-CM | POA: Diagnosis not present

## 2021-07-16 DIAGNOSIS — I10 Essential (primary) hypertension: Secondary | ICD-10-CM

## 2021-07-16 DIAGNOSIS — Z6841 Body Mass Index (BMI) 40.0 and over, adult: Secondary | ICD-10-CM

## 2021-07-16 LAB — POCT GLYCOSYLATED HEMOGLOBIN (HGB A1C): Hemoglobin A1C: 7.4 % — AB (ref 4.0–5.6)

## 2021-07-16 MED ORDER — TIRZEPATIDE 5 MG/0.5ML ~~LOC~~ SOAJ: 5.0000 mg | SUBCUTANEOUS | 0 refills | Status: DC

## 2021-07-16 MED ORDER — AMLODIPINE BESYLATE 5 MG PO TABS: 7.5000 mg | ORAL_TABLET | Freq: Every day | ORAL | 0 refills | Status: DC

## 2021-07-16 MED ORDER — FLUOXETINE HCL 10 MG PO CAPS: 10.0000 mg | ORAL_CAPSULE | Freq: Every day | ORAL | 3 refills | Status: DC

## 2021-07-16 MED ORDER — LOSARTAN POTASSIUM-HCTZ 100-25 MG PO TABS: 1.0000 | ORAL_TABLET | Freq: Every day | ORAL | 0 refills | Status: DC

## 2021-07-16 MED ORDER — DOCUSATE SODIUM 100 MG PO CAPS: 100.0000 mg | ORAL_CAPSULE | Freq: Every day | ORAL | 2 refills | Status: AC

## 2021-07-16 NOTE — Patient Instructions (Addendum)
Great to see you today!!  Hemoglobin A1c was 7.4!!!!!!!! (3 months ago was 9.5) Awesome work!!! 7.0 or less is goal!   Please increase Mounjaro to 5 mg once weekly (this coming Monday take two of your 2.5 mg to finish that off). After 4 weeks, we can increase to 7.5 mg weekly if tolerating well.  Increase Amlodipine to 7.5 mg daily.  Keep working on health goals!

## 2021-07-16 NOTE — Progress Notes (Signed)
Subjective:    Patient ID: Raymond Costa, male    DOB: [birth year], 36 y.o.   MRN: [id number]  Chief Complaint  Patient presents with   Follow-up   Asthma    Pt is being seen as a follow up appt; pt is c/o weight gain and has changed diet and being active, but new medication may be causing weight gain instead of loss;     Patient is in today for regular 3 month f/up on T2DM, obesity, HTN, anxiety.   Weight has been going up instead of down. 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 Eating about 1-2 times per day. Cut back on bread. Cut out sodas, still drinking sweet tea. Eating dinner around 9 pm due to work, usually fasting til about 4 pm. Walking in the mornings. Work is sedentary and knows that doesn't help.  He has been taking Mounjaro once weekly on Mondays. Has been experiencing some upper abdominal pain intermittently. Nausea at start of medication, but doing good now. Bowel movements have slowed down.   Feels "ok" - wife suffered a miscarriage last month, their first positive pregnancy. Feeling optimistic still. Prozac 10 mg daily is helping with focus and settling anxious thoughts, esp in car, does podcasts and working on [employer].    Past Medical History:  Diagnosis Date   Anxiety    GERD (gastroesophageal reflux disease)    Hypertension    Obesity    Sleep apnea    Not on cpap machine     Past Surgical History:  Procedure Laterality Date   arm fracture surgery Left    36 years old     Family History  Problem Relation Age of Onset   Hypertension Mother    Diabetes Mother    Cancer Father        leukemia   Prostate cancer Maternal Grandfather    Colon cancer Neg Hx    Esophageal cancer Neg Hx     Social History   Tobacco Use   Smoking status: Never   Smokeless tobacco: Never  Vaping Use   Vaping Use: Never used  Substance Use Topics   Alcohol use: Yes    Comment: ocassionally   Drug use: Never     Allergies   Allergen Reactions   Metformin And Related Hives   Shellfish Allergy Hives    Review of Systems NEGATIVE UNLESS OTHERWISE INDICATED IN HPI      Objective:     BP (!) 158/88 (BP Location: Right Arm)   Pulse 85   Temp 97.8 F (36.6 C) (Temporal)   Ht 5\' 11"  (1.803 m)   Wt (!) 361 lb (163.7 kg)   SpO2 97%   BMI 50.35 kg/m   Wt Readings from Last 3 Encounters:  07/16/21 (!) 361 lb (163.7 kg)  05/01/21 (!) 343 lb 14.7 oz (156 kg)  04/16/21 (!) 344 lb (156 kg)    BP Readings from Last 3 Encounters:  07/16/21 (!) 158/88  04/16/21 (!) 150/90  03/19/21 (!) 163/100     Physical Exam Vitals and nursing note reviewed.  Constitutional:      General: He is not in acute distress.    Appearance: Normal appearance. He is obese. He is not toxic-appearing.  HENT:     Head: Normocephalic and atraumatic.  Eyes:     Extraocular Movements: Extraocular movements intact.     Conjunctiva/sclera: Conjunctivae normal.     Pupils: Pupils are equal, round, and  reactive to light.  Cardiovascular:     Rate and Rhythm: Normal rate and regular rhythm.     Pulses: Normal pulses.     Heart sounds: Normal heart sounds.  Pulmonary:     Effort: Pulmonary effort is normal.     Breath sounds: Normal breath sounds.  Musculoskeletal:        General: Normal range of motion.     Cervical back: Normal range of motion and neck supple.  Skin:    General: Skin is warm and dry.  Neurological:     General: No focal deficit present.     Mental Status: He is alert and oriented to person, place, and time.  Psychiatric:        Mood and Affect: Mood normal.        Behavior: Behavior normal.        Thought Content: Thought content normal.        Assessment & Plan:   Problem List Items Addressed This Visit       Cardiovascular and Mediastinum   Essential hypertension   Relevant Medications   amLODipine (NORVASC) 5 MG tablet   losartan-hydrochlorothiazide (HYZAAR) 100-25 MG tablet      Endocrine   Type 2 diabetes mellitus with hyperglycemia, without long-term current use of insulin (HCC) - Primary   Relevant Medications   tirzepatide (MOUNJARO) 5 MG/0.5ML Pen   losartan-hydrochlorothiazide (HYZAAR) 100-25 MG tablet   Other Relevant Orders   POCT HgB A1C (Completed)     Other   Anxiety about health   Relevant Medications   FLUoxetine (PROZAC) 10 MG capsule   Morbid obesity (HCC)   Relevant Medications   tirzepatide (MOUNJARO) 5 MG/0.5ML Pen   Other Visit Diagnoses     BMI 50.0-59.9, adult (HCC)       Relevant Medications   tirzepatide (MOUNJARO) 5 MG/0.5ML Pen        Meds ordered this encounter  Medications   tirzepatide (MOUNJARO) 5 MG/0.5ML Pen    Sig: Inject 5 mg into the skin once a week for 28 days.    Dispense:  6 mL    Refill:  0    Order Specific Question:   Supervising Provider    Answer:   Shelva Majestic [[id number]]   docusate sodium (COLACE) 100 MG capsule    Sig: Take 1 capsule (100 mg total) by mouth daily.    Dispense:  30 capsule    Refill:  2    Order Specific Question:   Supervising Provider    Answer:   Shelva Majestic [[id number]]   amLODipine (NORVASC) 5 MG tablet    Sig: Take 1.5 tablets (7.5 mg total) by mouth daily.    Dispense:  135 tablet    Refill:  0    Order Specific Question:   Supervising Provider    Answer:   Shelva Majestic [[id number]]   losartan-hydrochlorothiazide (HYZAAR) 100-25 MG tablet    Sig: Take 1 tablet by mouth daily.    Dispense:  90 tablet    Refill:  0    Order Specific Question:   Supervising Provider    Answer:   Shelva Majestic [[id number]]   FLUoxetine (PROZAC) 10 MG capsule    Sig: Take 1 capsule (10 mg total) by mouth daily.    Dispense:  90 capsule    Refill:  3    Order Specific Question:   Supervising Provider    Answer:   Shelva Majestic [phone]  1. Type 2 diabetes mellitus with hyperglycemia, without long-term current use of insulin (HCC) Great improvement in A1c. 9.5 to now 7.4 Increase  Mounjaro to 5 mg weekly  2. Morbid obesity (HCC) Keep working on health goals, inc in [place] should help  3. Essential hypertension Still not to goal Increase amlodipine to 7.5 mg daily  4. Anxiety about health Doing well with Prozac 10 mg, refilled  5. BMI 50.0-59.9, adult (HCC)  Return in about 4 weeks (around 08/13/2021) for medication recheck .  This note was prepared with assistance of Conservation officer, historic buildings. Occasional wrong-word or sound-a-like substitutions may have occurred due to the inherent limitations of voice recognition software.    Verdelle Valtierra M Adonay Scheier, PA-C

## 2021-08-13 ENCOUNTER — Ambulatory Visit: Payer: 59 | Admitting: Physician Assistant

## 2021-08-13 ENCOUNTER — Encounter: Payer: Self-pay | Admitting: Physician Assistant

## 2021-08-13 VITALS — BP 150/94 | HR 85 | Temp 97.2°F | Ht 71.0 in | Wt 359.4 lb

## 2021-08-13 DIAGNOSIS — Z6841 Body Mass Index (BMI) 40.0 and over, adult: Secondary | ICD-10-CM | POA: Diagnosis not present

## 2021-08-13 DIAGNOSIS — I10 Essential (primary) hypertension: Secondary | ICD-10-CM

## 2021-08-13 DIAGNOSIS — E1165 Type 2 diabetes mellitus with hyperglycemia: Secondary | ICD-10-CM | POA: Diagnosis not present

## 2021-08-13 MED ORDER — AMLODIPINE BESYLATE 10 MG PO TABS: 10.0000 mg | ORAL_TABLET | Freq: Every day | ORAL | 0 refills | Status: DC

## 2021-08-13 NOTE — Progress Notes (Signed)
Subjective:    Patient ID: Raymond Costa, male    DOB: Feb 06, 1985, 36 y.o.   MRN: [id number]  Chief Complaint  Patient presents with   Follow-up    Pt f/u for medication check and refills; wants to discuss Mounjaro and price increase; pt states he is gaining weight since starting the shot;     HPI Patient is in today for f/up on T2DM, morbid obesity, HTN. Not much change from last visit. States he went to get next dose of Mounjaro filled and was told it would be $500 with insurance. Did not have this last week's shot. Taking all other meds as directed. Still working on lifestyle changes.   Past Medical History:  Diagnosis Date   Anxiety    GERD (gastroesophageal reflux disease)    Hypertension    Obesity    Sleep apnea    Not on cpap machine     Past Surgical History:  Procedure Laterality Date   arm fracture surgery Left    36 years old     Family History  Problem Relation Age of Onset   Hypertension Mother    Diabetes Mother    Cancer Father        leukemia   Prostate cancer Maternal Grandfather    Colon cancer Neg Hx    Esophageal cancer Neg Hx     Social History   Tobacco Use   Smoking status: Never   Smokeless tobacco: Never  Vaping Use   Vaping Use: Never used  Substance Use Topics   Alcohol use: Yes    Comment: ocassionally   Drug use: Never     Allergies  Allergen Reactions   Metformin And Related Hives   Shellfish Allergy Hives    Review of Systems NEGATIVE UNLESS OTHERWISE INDICATED IN HPI      Objective:     BP (!) 150/94 (BP Location: Right Arm)   Pulse 85   Temp (!) 97.2 F (36.2 C) (Temporal)   Ht 5\' 11"  (1.803 m)   Wt (!) 359 lb 6.4 oz (163 kg)   SpO2 98%   BMI 50.13 kg/m   Wt Readings from Last 3 Encounters:  08/13/21 (!) 359 lb 6.4 oz (163 kg)  07/16/21 (!) 361 lb (163.7 kg)  05/01/21 (!) 343 lb 14.7 oz (156 kg)    BP Readings from Last 3 Encounters:  08/13/21 (!) 150/94  07/16/21 (!) 154/86  04/16/21 (!)  150/90     Physical Exam Vitals and nursing note reviewed.  Constitutional:      General: He is not in acute distress.    Appearance: Normal appearance. He is obese. He is not toxic-appearing.  HENT:     Head: Normocephalic and atraumatic.  Eyes:     Extraocular Movements: Extraocular movements intact.     Conjunctiva/sclera: Conjunctivae normal.     Pupils: Pupils are equal, round, and reactive to light.  Cardiovascular:     Rate and Rhythm: Normal rate and regular rhythm.     Pulses: Normal pulses.     Heart sounds: Normal heart sounds.  Pulmonary:     Effort: Pulmonary effort is normal.     Breath sounds: Normal breath sounds.  Musculoskeletal:        General: Normal range of motion.     Cervical back: Normal range of motion and neck supple.  Skin:    General: Skin is warm and dry.  Neurological:     General: No focal deficit present.  Mental Status: He is alert and oriented to person, place, and time.  Psychiatric:        Mood and Affect: Mood normal.        Behavior: Behavior normal.        Thought Content: Thought content normal.        Assessment & Plan:   Problem List Items Addressed This Visit       Cardiovascular and Mediastinum   Essential hypertension   Relevant Medications   amLODipine (NORVASC) 10 MG tablet     Endocrine   Type 2 diabetes mellitus with hyperglycemia, without long-term current use of insulin (HCC) - Primary     Other   Morbid obesity (HCC)   Other Visit Diagnoses     BMI 50.0-59.9, adult (HCC)            Meds ordered this encounter  Medications   amLODipine (NORVASC) 10 MG tablet    Sig: Take 1 tablet (10 mg total) by mouth daily.    Dispense:  90 tablet    Refill:  0    Order Specific Question:   Supervising Provider    Answer:   Durene Cal, STEPHEN O [[id number]]    1. Type 2 diabetes mellitus with hyperglycemia, without long-term current use of insulin (HCC)  2. Morbid obesity (HCC) Pt was doing well with Mounjaro,  not having side effects. Plan to connect with his pharmacy about pricing - should not cost this much and coupon code is available also. Plan to increase to Mounjaro 5 mg once weekly. Cont to work on lifestyle.  3. Essential hypertension BP still elevated. Cont Hyzaar 100-25 mg once daily. Inc Amlodipine to 10 mg once daily. Monitor at home. Limit salt.   Still trending in right direction, encouraged him to keep working towards goals!    Return in about 4 weeks (around 09/10/2021) for recheck blood pressure .   Amberia Bayless M Melana Hingle, PA-C

## 2021-08-17 ENCOUNTER — Telehealth: Payer: Self-pay | Admitting: Physician Assistant

## 2021-08-17 NOTE — Telephone Encounter (Signed)
Patient states: Raymond Costa price has increased  - He was informed during OV with PCP on 08/03 that he would be contacted with options after PCP looked into the matter  Patient requests:  - A callback with more information on this matter.

## 2021-08-17 NOTE — Telephone Encounter (Signed)
Please see message. °

## 2021-08-19 MED ORDER — TIRZEPATIDE 5 MG/0.5ML ~~LOC~~ SOAJ: 5.0000 mg | SUBCUTANEOUS | 0 refills | Status: AC

## 2021-08-19 NOTE — Telephone Encounter (Signed)
Contacted pt advised what I was told per pharmacy copay not being 500.00. Advised pt how to use Mounjaro savings card for 25.00 per month for medication. Emailed savings card to pt and he verified he received in his email.

## 2021-09-10 ENCOUNTER — Ambulatory Visit: Payer: 59 | Admitting: Physician Assistant

## 2021-09-22 ENCOUNTER — Ambulatory Visit: Payer: 59 | Admitting: Physician Assistant

## 2021-11-26 ENCOUNTER — Other Ambulatory Visit: Payer: Self-pay | Admitting: Physician Assistant

## 2021-12-17 ENCOUNTER — Encounter: Payer: Self-pay | Admitting: Physician Assistant

## 2021-12-17 ENCOUNTER — Ambulatory Visit (INDEPENDENT_AMBULATORY_CARE_PROVIDER_SITE_OTHER): Payer: 59 | Admitting: Physician Assistant

## 2021-12-17 VITALS — BP 147/94 | HR 95 | Temp 98.0°F | Ht 71.0 in | Wt 365.0 lb

## 2021-12-17 DIAGNOSIS — Z0000 Encounter for general adult medical examination without abnormal findings: Secondary | ICD-10-CM | POA: Diagnosis not present

## 2021-12-17 DIAGNOSIS — F418 Other specified anxiety disorders: Secondary | ICD-10-CM

## 2021-12-17 DIAGNOSIS — E1165 Type 2 diabetes mellitus with hyperglycemia: Secondary | ICD-10-CM | POA: Diagnosis not present

## 2021-12-17 DIAGNOSIS — I10 Essential (primary) hypertension: Secondary | ICD-10-CM | POA: Diagnosis not present

## 2021-12-17 NOTE — Progress Notes (Signed)
Subjective:    Patient ID: Raymond Costa, male    DOB: Jan 24, 1985, 36 y.o.   MRN: [id number]  Chief Complaint  Patient presents with   Annual Exam    Pt has tickle in throat/cough    HPI Patient is in today for annual exam and also to recheck on chronic medical conditions.  Patient states since last visit, his mother has passed away and his wife has also experienced a second miscarriage.  He states that they are praying through things and that he is doing okay.  Dietary and exercise habits have been poor.  He has been taking his medication. Not checking his BP at home.    Past Medical History:  Diagnosis Date   Anxiety    GERD (gastroesophageal reflux disease)    Hypertension    Obesity    Sleep apnea    Not on cpap machine     Past Surgical History:  Procedure Laterality Date   arm fracture surgery Left    36 years old     Family History  Problem Relation Age of Onset   Hypertension Mother    Diabetes Mother    Cancer Father        leukemia   Prostate cancer Maternal Grandfather    Colon cancer Neg Hx    Esophageal cancer Neg Hx     Social History   Tobacco Use   Smoking status: Never   Smokeless tobacco: Never  Vaping Use   Vaping Use: Never used  Substance Use Topics   Alcohol use: Not Currently    Comment: ocassionally   Drug use: Never     Allergies  Allergen Reactions   Metformin And Related Hives   Shellfish Allergy Hives    Review of Systems NEGATIVE UNLESS OTHERWISE INDICATED IN HPI      Objective:     BP (!) 147/94 (BP Location: Left Arm, Patient Position: Sitting)   Pulse 95   Temp 98 F (36.7 C) (Temporal)   Ht 5\' 11"  (1.803 m)   Wt (!) 365 lb (165.6 kg)   SpO2 97%   BMI 50.91 kg/m   Wt Readings from Last 3 Encounters:  12/17/21 (!) 365 lb (165.6 kg)  08/13/21 (!) 359 lb 6.4 oz (163 kg)  07/16/21 (!) 361 lb (163.7 kg)    BP Readings from Last 3 Encounters:  12/17/21 (!) 147/94  08/13/21 (!) 150/94  07/16/21 (!)  154/86     Physical Exam Vitals and nursing note reviewed.  Constitutional:      General: He is not in acute distress.    Appearance: Normal appearance. He is morbidly obese. He is not toxic-appearing.  HENT:     Head: Normocephalic and atraumatic.     Right Ear: Tympanic membrane normal.     Left Ear: Tympanic membrane normal.     Mouth/Throat:     Mouth: Mucous membranes are moist.  Eyes:     Extraocular Movements: Extraocular movements intact.     Conjunctiva/sclera: Conjunctivae normal.     Pupils: Pupils are equal, round, and reactive to light.  Cardiovascular:     Rate and Rhythm: Normal rate and regular rhythm.     Pulses: Normal pulses.     Heart sounds: Normal heart sounds.  Pulmonary:     Effort: Pulmonary effort is normal.     Breath sounds: Normal breath sounds.  Abdominal:     General: Abdomen is flat.     Palpations: Abdomen is soft.  Musculoskeletal:        General: Normal range of motion.     Cervical back: Normal range of motion and neck supple.  Skin:    General: Skin is warm and dry.  Neurological:     General: No focal deficit present.     Mental Status: He is alert and oriented to person, place, and time.  Psychiatric:        Mood and Affect: Mood normal.        Behavior: Behavior normal.        Thought Content: Thought content normal.        Assessment & Plan:  Encounter for annual physical exam -     CBC with Differential/Platelet -     Comprehensive metabolic panel -     Lipid panel -     Hemoglobin A1c  Type 2 diabetes mellitus with hyperglycemia, without long-term current use of insulin (HCC) Assessment & Plan: Could not tolerate Metformin Running out of Mounjaro, but not taking consistently Recheck labs today   Orders: -     Comprehensive metabolic panel -     Lipid panel -     Hemoglobin A1c  Morbid obesity (HCC) Assessment & Plan: Strongly emphasized need to lose weight   Orders: -     CBC with Differential/Platelet -      Comprehensive metabolic panel -     Lipid panel -     Hemoglobin A1c  Essential hypertension Assessment & Plan: Uncontrolled  Currently on amlodipine 10 mg daily & hyzaar 100-25 mg daily  Orders: -     CBC with Differential/Platelet -     Comprehensive metabolic panel  Anxiety about health Assessment & Plan: Prozac 10 mg daily Recently worse with loss of mother Declines additional help at this time.     Age-appropriate screening and counseling performed today. Will check labs and call with results. Preventive measures discussed and printed in AVS for patient.   Patient Counseling: [x]   Nutrition: Stressed importance of moderation in sodium/caffeine intake, saturated fat and cholesterol, caloric balance, sufficient intake of fresh fruits, vegetables, and fiber.  [x]   Stressed the importance of regular exercise.   [x]   Substance Abuse: Discussed cessation/primary prevention of tobacco, alcohol, or other drug use; driving or other dangerous activities under the influence; availability of treatment for abuse.   []   Injury prevention: Discussed safety belts, safety helmets, smoke detector, smoking near bedding or upholstery.   []   Sexuality: Discussed sexually transmitted diseases, partner selection, use of condoms, avoidance of unintended pregnancy  and contraceptive alternatives.   [x]   Dental health: Discussed importance of regular tooth brushing, flossing, and dental visits.  [x]   Health maintenance and immunizations reviewed. Please refer to Health maintenance section.        Return in about 4 months (around 04/18/2022) for recheck .  This note was prepared with assistance of . Occasional wrong-word or sound-a-like substitutions may have occurred due to the inherent limitations of voice recognition software.    Aldine Grainger M Talmage Teaster, PA-C

## 2021-12-18 LAB — LIPID PANEL
Cholesterol: 135 mg/dL (ref 0–200)
HDL: 33.2 mg/dL — ABNORMAL LOW (ref >39.00)
LDL Cholesterol: 70 mg/dL (ref 0–99)
NonHDL: 102.04
Total CHOL/HDL Ratio: 4
Triglycerides: 159 mg/dL — ABNORMAL HIGH (ref 0.0–149.0)
VLDL: 31.8 mg/dL (ref 0.0–40.0)

## 2021-12-18 LAB — COMPREHENSIVE METABOLIC PANEL
ALT: 65 U/L — ABNORMAL HIGH (ref 0–53)
AST: 55 U/L — ABNORMAL HIGH (ref 0–37)
Albumin: 4.3 g/dL (ref 3.5–5.2)
Alkaline Phosphatase: 73 U/L (ref 39–117)
BUN: 8 mg/dL (ref 6–23)
CO2: 28 mEq/L (ref 19–32)
Calcium: 9.4 mg/dL (ref 8.4–10.5)
Chloride: 98 mEq/L (ref 96–112)
Creatinine, Ser: 0.9 mg/dL (ref 0.40–1.50)
GFR: 110.7 mL/min (ref >60.00)
Glucose, Bld: 164 mg/dL — ABNORMAL HIGH (ref 70–99)
Potassium: 3.9 mEq/L (ref 3.5–5.1)
Sodium: 135 mEq/L (ref 135–145)
Total Bilirubin: 0.8 mg/dL (ref 0.2–1.2)
Total Protein: 7.6 g/dL (ref 6.0–8.3)

## 2021-12-18 LAB — CBC WITH DIFFERENTIAL/PLATELET
Basophils Absolute: 0.1 10*3/uL (ref 0.0–0.1)
Basophils Relative: 0.8 % (ref 0.0–3.0)
Eosinophils Absolute: 0.1 10*3/uL (ref 0.0–0.7)
Eosinophils Relative: 2 % (ref 0.0–5.0)
HCT: 45.9 % (ref 39.0–52.0)
Hemoglobin: 16.4 g/dL (ref 13.0–17.0)
Lymphocytes Relative: 35.9 % (ref 12.0–46.0)
Lymphs Abs: 2.5 10*3/uL (ref 0.7–4.0)
MCHC: 35.7 g/dL (ref 30.0–36.0)
MCV: 82.4 fl (ref 78.0–100.0)
Monocytes Absolute: 0.7 10*3/uL (ref 0.1–1.0)
Monocytes Relative: 9.9 % (ref 3.0–12.0)
Neutro Abs: 3.6 10*3/uL (ref 1.4–7.7)
Neutrophils Relative %: 51.4 % (ref 43.0–77.0)
Platelets: 273 10*3/uL (ref 150.0–400.0)
RBC: 5.57 Mil/uL (ref 4.22–5.81)
RDW: 15 % (ref 11.5–15.5)
WBC: 7.1 10*3/uL (ref 4.0–10.5)

## 2021-12-18 LAB — HEMOGLOBIN A1C: Hgb A1c MFr Bld: 8.1 % — ABNORMAL HIGH (ref 4.6–6.5)

## 2021-12-21 NOTE — Assessment & Plan Note (Signed)
Uncontrolled  Currently on amlodipine 10 mg daily & hyzaar 100-25 mg daily

## 2021-12-21 NOTE — Assessment & Plan Note (Signed)
Could not tolerate Metformin Running out of Mounjaro, but not taking consistently Recheck labs today

## 2021-12-21 NOTE — Assessment & Plan Note (Signed)
Strongly emphasized need to lose weight

## 2021-12-21 NOTE — Assessment & Plan Note (Signed)
Prozac 10 mg daily Recently worse with loss of mother Declines additional help at this time.

## 2022-01-05 ENCOUNTER — Telehealth: Payer: Self-pay | Admitting: Physician Assistant

## 2022-01-05 ENCOUNTER — Other Ambulatory Visit: Payer: Self-pay | Admitting: Physician Assistant

## 2022-01-05 MED ORDER — TIRZEPATIDE 5 MG/0.5ML ~~LOC~~ SOAJ: 5.0000 mg | SUBCUTANEOUS | 2 refills | Status: DC

## 2022-01-05 NOTE — Telephone Encounter (Signed)
Mounjaro not on current med list, ok to refill?

## 2022-01-05 NOTE — Telephone Encounter (Signed)
  Encourage patient to contact the pharmacy for refills or they can request refills through [facility]  LAST APPOINTMENT DATE:  Please schedule appointment if longer than 1 year  NEXT APPOINTMENT DATE:  MEDICATION: Greggory Keen   Is the patient out of medication? Yes  [id number] [place], [place] - [phone] Phone: [phone]  Fax: [phone]      Let patient know to contact pharmacy at the end of the day to make sure medication is ready.  Please notify patient to allow 48-72 hours to process

## 2022-01-05 NOTE — Telephone Encounter (Signed)
Called and spoke with pt and below message given. 

## 2022-01-07 ENCOUNTER — Encounter: Payer: Self-pay | Admitting: Physician Assistant

## 2022-01-07 NOTE — Telephone Encounter (Signed)
Pt is on the line and states he needs meds ASAP.

## 2022-01-12 ENCOUNTER — Other Ambulatory Visit: Payer: Self-pay | Admitting: Physician Assistant

## 2022-01-12 MED ORDER — DAPAGLIFLOZIN PROPANEDIOL 5 MG PO TABS: 5.0000 mg | ORAL_TABLET | Freq: Every day | ORAL | 2 refills | Status: DC

## 2022-01-13 ENCOUNTER — Other Ambulatory Visit: Payer: Self-pay | Admitting: Physician Assistant

## 2022-01-13 MED ORDER — GLIMEPIRIDE 1 MG PO TABS: 1.0000 mg | ORAL_TABLET | Freq: Every day | ORAL | 0 refills | Status: DC

## 2022-01-13 NOTE — Telephone Encounter (Signed)
Please see pt response, pt has no insurance at this time

## 2022-01-13 NOTE — Telephone Encounter (Signed)
Please see pt response.

## 2022-02-08 ENCOUNTER — Other Ambulatory Visit: Payer: Self-pay | Admitting: Physician Assistant

## 2022-03-09 ENCOUNTER — Other Ambulatory Visit: Payer: Self-pay | Admitting: Physician Assistant

## 2022-03-29 ENCOUNTER — Other Ambulatory Visit: Payer: Self-pay | Admitting: Physician Assistant

## 2022-03-29 DIAGNOSIS — I10 Essential (primary) hypertension: Secondary | ICD-10-CM

## 2022-04-09 ENCOUNTER — Other Ambulatory Visit: Payer: Self-pay | Admitting: Physician Assistant

## 2022-05-11 ENCOUNTER — Other Ambulatory Visit: Payer: Self-pay | Admitting: Physician Assistant

## 2022-05-21 ENCOUNTER — Telehealth: Payer: Self-pay

## 2022-05-21 ENCOUNTER — Other Ambulatory Visit: Payer: Self-pay

## 2022-05-21 DIAGNOSIS — E1165 Type 2 diabetes mellitus with hyperglycemia: Secondary | ICD-10-CM

## 2022-05-21 MED ORDER — TIRZEPATIDE 5 MG/0.5ML ~~LOC~~ SOAJ: 5.0000 mg | SUBCUTANEOUS | 2 refills | Status: DC

## 2022-05-21 NOTE — Telephone Encounter (Signed)
Patient requesting information on prior auth for [facility]. Please advise status

## 2022-05-26 ENCOUNTER — Telehealth: Payer: Self-pay

## 2022-05-26 ENCOUNTER — Other Ambulatory Visit (HOSPITAL_COMMUNITY): Payer: Self-pay

## 2022-05-26 NOTE — Telephone Encounter (Signed)
Pharmacy Patient Advocate Encounter   Received notification that prior authorization for Mounjaro 5mg /0.76ml is required/requested.   Per test claim, copay $25.00 with [facility].   Placed a call to the insurance to verify if a prior authorization is needed, per representative, a prior authorization is not needed for a diagnosis of type 2 diabetes or 4 injections per 28 days.   Called the pharmacy to notify a prior authorization is not needed and the pharmacist stated she has received a paid claim but the medication is on back order.

## 2022-05-27 NOTE — Telephone Encounter (Signed)
Advised pt PA not needed for [facility] rx and can contact pharmacy or let us know if finding it somewhere in stock, we cna send Rx to. Pt scheduled appt for next week for diabetes follow up but wants to know in the meantime, does he need to continue Glimperide Rx with Mounjaro? Please advise

## 2022-05-28 ENCOUNTER — Other Ambulatory Visit: Payer: Self-pay | Admitting: Physician Assistant

## 2022-05-28 DIAGNOSIS — E1165 Type 2 diabetes mellitus with hyperglycemia: Secondary | ICD-10-CM

## 2022-05-28 MED ORDER — TIRZEPATIDE 5 MG/0.5ML ~~LOC~~ SOAJ: 5.0000 mg | SUBCUTANEOUS | 2 refills | Status: DC

## 2022-05-28 NOTE — Telephone Encounter (Signed)
Called pt and lvm with provider recommendations and cb number if needing to return my call.

## 2022-06-02 ENCOUNTER — Ambulatory Visit (INDEPENDENT_AMBULATORY_CARE_PROVIDER_SITE_OTHER): Payer: No Typology Code available for payment source | Admitting: Physician Assistant

## 2022-06-02 ENCOUNTER — Encounter: Payer: Self-pay | Admitting: Physician Assistant

## 2022-06-02 VITALS — BP 156/102 | HR 88 | Temp 97.1°F | Ht 71.0 in | Wt 356.4 lb

## 2022-06-02 DIAGNOSIS — E1165 Type 2 diabetes mellitus with hyperglycemia: Secondary | ICD-10-CM

## 2022-06-02 DIAGNOSIS — R7989 Other specified abnormal findings of blood chemistry: Secondary | ICD-10-CM | POA: Diagnosis not present

## 2022-06-02 DIAGNOSIS — I10 Essential (primary) hypertension: Secondary | ICD-10-CM

## 2022-06-02 DIAGNOSIS — Z7985 Long-term (current) use of injectable non-insulin antidiabetic drugs: Secondary | ICD-10-CM

## 2022-06-02 LAB — HEPATIC FUNCTION PANEL
ALT: 55 U/L — ABNORMAL HIGH (ref 0–53)
AST: 38 U/L — ABNORMAL HIGH (ref 0–37)
Albumin: 3.8 g/dL (ref 3.5–5.2)
Alkaline Phosphatase: 75 U/L (ref 39–117)
Bilirubin, Direct: 0.1 mg/dL (ref 0.0–0.3)
Total Bilirubin: 0.7 mg/dL (ref 0.2–1.2)
Total Protein: 6.8 g/dL (ref 6.0–8.3)

## 2022-06-02 LAB — POCT GLYCOSYLATED HEMOGLOBIN (HGB A1C): Hemoglobin A1C: 9.7 % — AB (ref 4.0–5.6)

## 2022-06-02 LAB — MICROALBUMIN / CREATININE URINE RATIO
Creatinine,U: 270.7 mg/dL
Microalb Creat Ratio: 2.6 mg/g (ref 0.0–30.0)
Microalb, Ur: 7.1 mg/dL — ABNORMAL HIGH (ref 0.0–1.9)

## 2022-06-02 MED ORDER — MOUNJARO 2.5 MG/0.5ML ~~LOC~~ SOAJ
2.5000 mg | SUBCUTANEOUS | 0 refills | Status: DC
Start: 2022-06-02 — End: 2022-06-08

## 2022-06-02 MED ORDER — HYDROCHLOROTHIAZIDE 25 MG PO TABS
25.0000 mg | ORAL_TABLET | Freq: Every day | ORAL | 3 refills | Status: DC
Start: 2022-06-02 — End: 2022-12-20

## 2022-06-02 MED ORDER — AMLODIPINE-OLMESARTAN 10-40 MG PO TABS
1.0000 | ORAL_TABLET | Freq: Every day | ORAL | 3 refills | Status: DC
Start: 2022-06-02 — End: 2022-12-20

## 2022-06-02 NOTE — Assessment & Plan Note (Signed)
Lab Results  Component Value Date   HGBA1C 9.7 (A) 06/02/2022   HGBA1C 8.1 (H) 12/17/2021   HGBA1C 7.4 (A) 07/16/2021   Worsening, hasn't been taking Mounjaro the last 5 months, insurance didn't cover last year. Not taking care of himself well since mom's passing. Need to improve lifestyle changes. --Start back on Mounjaro regimen [employer] covers now, per pt). --Continue Glimepiride 1 mg daily --microalbumin today  He has an appointment set up with eye doctor; hx of keratoconus.  Denies any changes or problems with his vision at this time.

## 2022-06-02 NOTE — Patient Instructions (Addendum)
Good to see you today! Very glad to meet your wife with you as well!   Please start back on Mounjaro regimen. Start with 2.5 mg dosing once weekly x 4 weeks, then increase to the 5 mg dosing x 4 weeks, then call and let me know how you're doing if you want to increase again or stay at the 5 mg. --Small, frequent meals, high in protein, low in fat recommended. --Work on exercise regimen  Blood pressure still running high. STOP the amlodipine 10 mg, STOP the losartan-hctz. START AZOR 10-40 mg, which is amlodipine-olmesartan combination.  START hydrochlorothiazide 25 mg. Monitor at home. Goal is 130/80.  Microalbumin and liver function tests today.

## 2022-06-02 NOTE — Assessment & Plan Note (Signed)
STOP the amlodipine 10 mg, STOP the losartan-hctz. START AZOR 10-40 mg, which is amlodipine-olmesartan combination.  START hydrochlorothiazide 25 mg. Monitor at home. Goal is 130/80.

## 2022-06-02 NOTE — Progress Notes (Signed)
Subjective:    Patient ID: Raymond Costa, male    DOB: 1985/02/14, 37 y.o.   MRN: [id number]  Chief Complaint  Patient presents with   Diabetes   Medical Management of Chronic Issues    Pt in office for Diabetes follow up; pt says Mounjaro wasn't covered under previous insurance, but current insurance will approve it per patient. Pt lost Mom unexpectedly in October,     Diabetes   Patient is in today for follow-up T2DM, HTN. See A/P.    Past Medical History:  Diagnosis Date   Anxiety    GERD (gastroesophageal reflux disease)    Hypertension    Obesity    Sleep apnea    Not on cpap machine     Past Surgical History:  Procedure Laterality Date   arm fracture surgery Left    37 years old     Family History  Problem Relation Age of Onset   Hypertension Mother    Diabetes Mother    Cancer Father        leukemia   Prostate cancer Maternal Grandfather    Colon cancer Neg Hx    Esophageal cancer Neg Hx     Social History   Tobacco Use   Smoking status: Never   Smokeless tobacco: Never  Vaping Use   Vaping Use: Never used  Substance Use Topics   Alcohol use: Not Currently    Comment: ocassionally   Drug use: Never     Allergies  Allergen Reactions   Metformin And Related Hives   Shellfish Allergy Hives    Review of Systems NEGATIVE UNLESS OTHERWISE INDICATED IN HPI      Objective:     BP (!) 156/102   Pulse 88   Temp (!) 97.1 F (36.2 C) (Temporal)   Ht 5\' 11"  (1.803 m)   Wt (!) 356 lb 6.4 oz (161.7 kg)   SpO2 98%   BMI 49.71 kg/m   Wt Readings from Last 3 Encounters:  06/02/22 (!) 356 lb 6.4 oz (161.7 kg)  12/17/21 (!) 365 lb (165.6 kg)  08/13/21 (!) 359 lb 6.4 oz (163 kg)    BP Readings from Last 3 Encounters:  06/02/22 (!) 156/102  12/17/21 (!) 147/94  08/13/21 (!) 150/94     Physical Exam Vitals and nursing note reviewed.  Constitutional:      General: He is not in acute distress.    Appearance: Normal appearance. He is  morbidly obese. He is not toxic-appearing.  HENT:     Head: Normocephalic and atraumatic.     Mouth/Throat:     Mouth: Mucous membranes are moist.  Eyes:     Extraocular Movements: Extraocular movements intact.     Conjunctiva/sclera: Conjunctivae normal.     Pupils: Pupils are equal, round, and reactive to light.  Cardiovascular:     Rate and Rhythm: Normal rate and regular rhythm.     Pulses: Normal pulses.     Heart sounds: Normal heart sounds.  Pulmonary:     Effort: Pulmonary effort is normal.     Breath sounds: Normal breath sounds.  Musculoskeletal:        General: Normal range of motion.     Cervical back: Normal range of motion and neck supple.     Right lower leg: No edema.     Left lower leg: No edema.  Skin:    General: Skin is warm and dry.  Neurological:     General: No focal deficit  present.     Mental Status: He is alert and oriented to person, place, and time.  Psychiatric:        Mood and Affect: Mood normal.        Behavior: Behavior normal.        Thought Content: Thought content normal.        Assessment & Plan:  Type 2 diabetes mellitus with hyperglycemia, without long-term current use of insulin (HCC) Assessment & Plan: Lab Results  Component Value Date   HGBA1C 9.7 (A) 06/02/2022   HGBA1C 8.1 (H) 12/17/2021   HGBA1C 7.4 (A) 07/16/2021   Worsening, hasn't been taking Mounjaro the last 5 months, insurance didn't cover last year. Not taking care of himself well since mom's passing. Need to improve lifestyle changes. --Start back on Mounjaro regimen [employer] covers now, per pt). --Continue Glimepiride 1 mg daily --microalbumin today  He has an appointment set up with eye doctor; hx of keratoconus.  Denies any changes or problems with his vision at this time.   Orders: -     POCT glycosylated hemoglobin (Hb A1C) -     Mounjaro; Inject 2.5 mg into the skin once a week.  Dispense: 2 mL; Refill: 0 -     Microalbumin / creatinine urine  ratio  Uncontrolled hypertension Assessment & Plan: STOP the amlodipine 10 mg, STOP the losartan-hctz. START AZOR 10-40 mg, which is amlodipine-olmesartan combination.  START hydrochlorothiazide 25 mg. Monitor at home. Goal is 130/80.  Orders: -     amLODIPine-Olmesartan; Take 1 tablet by mouth daily.  Dispense: 90 tablet; Refill: 3 -     hydroCHLOROthiazide; Take 1 tablet (25 mg total) by mouth daily.  Dispense: 90 tablet; Refill: 3  Elevated liver function tests -     Hepatic function panel        Return in about 3 months (around 09/02/2022) for recheck/follow-up.     Shakeeta Godette M Revere Maahs, PA-C

## 2022-06-03 ENCOUNTER — Other Ambulatory Visit: Payer: Self-pay

## 2022-06-03 DIAGNOSIS — R7989 Other specified abnormal findings of blood chemistry: Secondary | ICD-10-CM

## 2022-06-07 ENCOUNTER — Other Ambulatory Visit (HOSPITAL_COMMUNITY): Payer: Self-pay

## 2022-06-08 ENCOUNTER — Other Ambulatory Visit: Payer: Self-pay

## 2022-06-08 ENCOUNTER — Encounter: Payer: Self-pay | Admitting: Physician Assistant

## 2022-06-08 DIAGNOSIS — E1165 Type 2 diabetes mellitus with hyperglycemia: Secondary | ICD-10-CM

## 2022-06-08 MED ORDER — MOUNJARO 2.5 MG/0.5ML ~~LOC~~ SOPN
2.5000 mg | PEN_INJECTOR | SUBCUTANEOUS | 0 refills | Status: AC
Start: 2022-06-08 — End: 2022-07-08

## 2022-06-08 NOTE — Telephone Encounter (Signed)
Returned pt call and sent 2.5 Mounjaro to [employer] as requested. Pt verbalized understanding

## 2022-06-10 ENCOUNTER — Other Ambulatory Visit (INDEPENDENT_AMBULATORY_CARE_PROVIDER_SITE_OTHER): Payer: No Typology Code available for payment source

## 2022-06-10 DIAGNOSIS — R7989 Other specified abnormal findings of blood chemistry: Secondary | ICD-10-CM

## 2022-06-10 LAB — HEPATIC FUNCTION PANEL
ALT: 94 U/L — ABNORMAL HIGH (ref 0–53)
AST: 82 U/L — ABNORMAL HIGH (ref 0–37)
Albumin: 4.4 g/dL (ref 3.5–5.2)
Alkaline Phosphatase: 76 U/L (ref 39–117)
Bilirubin, Direct: 0.1 mg/dL (ref 0.0–0.3)
Total Bilirubin: 0.7 mg/dL (ref 0.2–1.2)
Total Protein: 7.8 g/dL (ref 6.0–8.3)

## 2022-06-15 ENCOUNTER — Other Ambulatory Visit: Payer: Self-pay | Admitting: Physician Assistant

## 2022-06-17 LAB — HM DIABETES EYE EXAM

## 2022-06-29 ENCOUNTER — Telehealth: Payer: No Typology Code available for payment source | Admitting: Physician Assistant

## 2022-06-29 DIAGNOSIS — H8110 Benign paroxysmal vertigo, unspecified ear: Secondary | ICD-10-CM | POA: Diagnosis not present

## 2022-06-29 NOTE — Patient Instructions (Signed)
Raymond Costa, thank you for joining [name], PA-C for today's virtual visit.  While this provider is not your primary care provider (PCP), if your PCP is located in our provider database this encounter information will be shared with them immediately following your visit.   A [facility] MyChart account gives you access to today's visit and all your visits, tests, and labs performed at [facility] " click here if you don't have a [facility] MyChart account or go to [email or web address]  Consent: (Patient) Jak Mill provided verbal consent for this virtual visit at the beginning of the encounter.  Current Medications:  Current Outpatient Medications:    amLODipine-olmesartan (AZOR) 10-40 MG tablet, Take 1 tablet by mouth daily., Disp: 90 tablet, Rfl: 3   FLUoxetine (PROZAC) 10 MG capsule, Take 1 capsule (10 mg total) by mouth daily., Disp: 90 capsule, Rfl: 3   glimepiride (AMARYL) 1 MG tablet, TAKE 1 TABLET BY MOUTH DAILY WITH BREAKFAST, Disp: 30 tablet, Rfl: 0   hydrochlorothiazide (HYDRODIURIL) 25 MG tablet, Take 1 tablet (25 mg total) by mouth daily., Disp: 90 tablet, Rfl: 3   tirzepatide (MOUNJARO) 2.5 MG/0.5ML Pen, Inject 2.5 mg into the skin once a week., Disp: 2 mL, Rfl: 0   Medications ordered in this encounter:  No orders of the defined types were placed in this encounter.    *If you need refills on other medications prior to your next appointment, please contact your pharmacy*  Follow-Up: Call back or seek an in-person evaluation if the symptoms worsen or if the condition fails to improve as anticipated.  [facility] [phone]  Other Instructions Please keep hydrated -- consider a small electrolyte water like gatorade or pedialyte. Do not skip meals -- try to keep a well-balanced diet. Start exercises listed below. Check BP at home as discussed. If not resolving or any new/worsening symptoms or BP is out of normal  range, please follow-up with your PCP in office ASAP.  How to Perform the Epley Maneuver The Epley maneuver is an exercise that relieves symptoms of vertigo. Vertigo is the feeling that you or your surroundings are moving when they are not. When you feel vertigo, you may feel like the room is spinning and may have trouble walking. The Epley maneuver is used for a type of vertigo caused by a calcium deposit in a part of the inner ear. The maneuver involves changing head positions to help the deposit move out of the area. You can do this maneuver at home whenever you have symptoms of vertigo. You can repeat it in 24 hours if your vertigo has not gone away. Even though the Epley maneuver may relieve your vertigo for a few weeks, it is possible that your symptoms will return. This maneuver relieves vertigo, but it does not relieve dizziness. What are the risks? If it is done correctly, the Epley maneuver is considered safe. Sometimes it can lead to dizziness or nausea that goes away after a short time. If you develop other symptoms--such as changes in vision, weakness, or numbness--stop doing the maneuver and call your health care provider. Supplies needed: A bed or table. A pillow. How to do the Epley maneuver     Sit on the edge of a bed or table with your back straight and your legs extended or hanging over the edge of the bed or table. Turn your head halfway toward the affected ear or side as told by your health care provider. Lie backward  quickly with your head turned until you are lying flat on your back. Your head should dangle (head-hanging position). You may want to position a pillow under your shoulders. Hold this position for at least 30 seconds. If you feel dizzy or have symptoms of vertigo, continue to hold the position until the symptoms stop. Turn your head to the opposite direction until your unaffected ear is facing down. Your head should continue to dangle. Hold this position for at  least 30 seconds. If you feel dizzy or have symptoms of vertigo, continue to hold the position until the symptoms stop. Turn your whole body to the same side as your head so that you are positioned on your side. Your head will now be nearly facedown and no longer needs to dangle. Hold for at least 30 seconds. If you feel dizzy or have symptoms of vertigo, continue to hold the position until the symptoms stop. Sit back up. You can repeat the maneuver in 24 hours if your vertigo does not go away. Follow these instructions at home: For 24 hours after doing the Epley maneuver: Keep your head in an upright position. When lying down to sleep or rest, keep your head raised (elevated) with two or more pillows. Avoid excessive neck movements. Activity Do not drive or use machinery if you feel dizzy. After doing the Epley maneuver, return to your normal activities as told by your health care provider. Ask your health care provider what activities are safe for you. General instructions Drink enough fluid to keep your urine pale yellow. Do not drink alcohol. Take over-the-counter and prescription medicines only as told by your health care provider. Keep all follow-up visits. This is important. Preventing vertigo symptoms Ask your health care provider if there is anything you should do at home to prevent vertigo. He or she may recommend that you: Keep your head elevated with two or more pillows while you sleep. Do not sleep on the side of your affected ear. Get up slowly from bed. Avoid sudden movements during the day. Avoid extreme head positions or movement, such as looking up or bending over. Contact a health care provider if: Your vertigo gets worse. You have other symptoms, including: Nausea. Vomiting. Headache. Get help right away if you: Have vision changes. Have a headache or neck pain that is severe or getting worse. Cannot stop vomiting. Have new numbness or weakness in any part of  your body. These symptoms may represent a serious problem that is an emergency. Do not wait to see if the symptoms will go away. Get medical help right away. Call your local emergency services (911 in the [place] Do not drive yourself to the hospital. Summary Vertigo is the feeling that you or your surroundings are moving when they are not. The Epley maneuver is an exercise that relieves symptoms of vertigo. If the Epley maneuver is done correctly, it is considered safe. This information is not intended to replace advice given to you by your health care provider. Make sure you discuss any questions you have with your health care provider. Document Revised: 11/28/2019 Document Reviewed: 11/28/2019 Elsevier Patient Education  2024 Elsevier Inc.   If you have been instructed to have an in-person evaluation today at a local Urgent Care facility, please use the link below. It will take you to a list of all of our available [facility], including address, phone number and hours of operation. Please do not delay care.  [facility]  If you or a family member do not have a primary care provider, use the link below to schedule a visit and establish care. When you choose a [facility] primary care physician or advanced practice provider, you gain a long-term partner in health. Find a Primary Care Provider  Learn more about [facility]'s in-office and virtual care options: [facility]

## 2022-06-29 NOTE — Progress Notes (Signed)
Virtual Visit Consent   Raymond Costa, you are scheduled for a virtual visit with a [facility] provider today. Just as with appointments in the office, your consent must be obtained to participate. Your consent will be active for this visit and any virtual visit you may have with one of our providers in the next 365 days. If you have a MyChart account, a copy of this consent can be sent to you electronically.  As this is a virtual visit, video technology does not allow for your provider to perform a traditional examination. This may limit your provider's ability to fully assess your condition. If your provider identifies any concerns that need to be evaluated in person or the need to arrange testing (such as labs, EKG, etc.), we will make arrangements to do so. Although advances in technology are sophisticated, we cannot ensure that it will always work on either your end or our end. If the connection with a video visit is poor, the visit may have to be switched to a telephone visit. With either a video or telephone visit, we are not always able to ensure that we have a secure connection.  By engaging in this virtual visit, you consent to the provision of healthcare and authorize for your insurance to be billed (if applicable) for the services provided during this visit. Depending on your insurance coverage, you may receive a charge related to this service.  I need to obtain your verbal consent now. Are you willing to proceed with your visit today? Raymond Costa has provided verbal consent on 06/29/2022 for a virtual visit (video or telephone). [name], [place]  Date: 06/29/2022 4:18 PM  Virtual Visit via Video Note   I, [name], connected with  Raymond Costa  ([id number], [birth year]) on 06/29/22 at  4:00 PM EDT by a video-enabled telemedicine application and verified that I am speaking with the correct person using two identifiers.  Location: Patient: Virtual Visit  Location Patient: Home Provider: Virtual Visit Location Provider: Home Office   I discussed the limitations of evaluation and management by telemedicine and the availability of in person appointments. The patient expressed understanding and agreed to proceed.    History of Present Illness: Raymond Costa is a 37 y.o. who identifies as a male who was assigned male at birth, and is being seen today for episode of dizziness. Notes this morning he woke up and getting ready for work and note he felt kid of swimmy headed with room spinning. Laid back down and rested. Got up had something to eat and feeling some better. Now occasional dizziness without full vertigo when turning his head quickly. Denies lightheadedness. Denies chest pain or SOB. Has been hydrating. Did start a "diet" yesterday trying to watch calories and increase intake of fruits and vegetables but notes he is not going without meals. Recently started on new BP medication regimen in the past few weeks of Azor 10-14 mg daily and HCTZ 25 mg daily. Notes taking as directed. Has BP cuff at home but not working presently.    HPI: HPI  Problems:  Patient Active Problem List   Diagnosis Date Noted   Morbid obesity (HCC) 07/16/2021   Type 2 diabetes mellitus with hyperglycemia, without long-term current use of insulin (HCC) 04/16/2021   Anxiety about health 04/16/2021   Uncontrolled hypertension 12/19/2018    Allergies:  Allergies  Allergen Reactions   Metformin And Related Hives   Shellfish Allergy Hives   Medications:  Current Outpatient  Medications:    amLODipine-olmesartan (AZOR) 10-40 MG tablet, Take 1 tablet by mouth daily., Disp: 90 tablet, Rfl: 3   FLUoxetine (PROZAC) 10 MG capsule, Take 1 capsule (10 mg total) by mouth daily., Disp: 90 capsule, Rfl: 3   glimepiride (AMARYL) 1 MG tablet, TAKE 1 TABLET BY MOUTH DAILY WITH BREAKFAST, Disp: 30 tablet, Rfl: 0   hydrochlorothiazide (HYDRODIURIL) 25 MG tablet, Take 1 tablet (25 mg  total) by mouth daily., Disp: 90 tablet, Rfl: 3   tirzepatide (MOUNJARO) 2.5 MG/0.5ML Pen, Inject 2.5 mg into the skin once a week., Disp: 2 mL, Rfl: 0  Observations/Objective: Patient is well-developed, well-nourished in no acute distress.  Resting comfortably at home.  Head is normocephalic, atraumatic.  No labored breathing. Speech is clear and coherent with logical content.  Patient is alert and oriented at baseline.   Assessment and Plan: 1. Benign paroxysmal positional vertigo, unspecified laterality  Sounds most like a very mild BPPV which is already improved. Giving medical history I do want him to get new BP cuff to keep regular checks on BP levels. If out of range, especially low, he is to let his PCP know ASAP. Epley maneuvers reviewed. Handout given. Increase fluids. If not resolving overnight or any new/worsening symptoms, he is to seek an in-person evaluation ASAP.  Follow Up Instructions: I discussed the assessment and treatment plan with the patient. The patient was provided an opportunity to ask questions and all were answered. The patient agreed with the plan and demonstrated an understanding of the instructions.  A copy of instructions were sent to the patient via MyChart unless otherwise noted below.    The patient was advised to call back or seek an in-person evaluation if the symptoms worsen or if the condition fails to improve as anticipated.  Time:  I spent 10 minutes with the patient via telehealth technology discussing the above problems/concerns.    [name], PA-C

## 2022-07-06 ENCOUNTER — Other Ambulatory Visit: Payer: No Typology Code available for payment source

## 2022-07-14 ENCOUNTER — Ambulatory Visit
Admission: RE | Admit: 2022-07-14 | Discharge: 2022-07-14 | Disposition: A | Discharge status: Home / Self Care | Payer: No Typology Code available for payment source | Source: Ambulatory Visit | Attending: Physician Assistant

## 2022-07-14 DIAGNOSIS — R7989 Other specified abnormal findings of blood chemistry: Secondary | ICD-10-CM

## 2022-07-15 ENCOUNTER — Other Ambulatory Visit: Payer: Self-pay | Admitting: Physician Assistant

## 2022-07-20 ENCOUNTER — Telehealth: Payer: No Typology Code available for payment source | Admitting: Family Medicine

## 2022-07-20 DIAGNOSIS — M5441 Lumbago with sciatica, right side: Secondary | ICD-10-CM

## 2022-07-20 DIAGNOSIS — R200 Anesthesia of skin: Secondary | ICD-10-CM

## 2022-07-20 NOTE — Progress Notes (Signed)
Virtual Visit Consent   Raymond Costa, you are scheduled for a virtual visit with a [facility] provider today. Just as with appointments in the office, your consent must be obtained to participate. Your consent will be active for this visit and any virtual visit you may have with one of our providers in the next 365 days. If you have a MyChart account, a copy of this consent can be sent to you electronically.  As this is a virtual visit, video technology does not allow for your provider to perform a traditional examination. This may limit your provider's ability to fully assess your condition. If your provider identifies any concerns that need to be evaluated in person or the need to arrange testing (such as labs, EKG, etc.), we will make arrangements to do so. Although advances in technology are sophisticated, we cannot ensure that it will always work on either your end or our end. If the connection with a video visit is poor, the visit may have to be switched to a telephone visit. With either a video or telephone visit, we are not always able to ensure that we have a secure connection.  By engaging in this virtual visit, you consent to the provision of healthcare and authorize for your insurance to be billed (if applicable) for the services provided during this visit. Depending on your insurance coverage, you may receive a charge related to this service.  I need to obtain your verbal consent now. Are you willing to proceed with your visit today? Raymond Costa has provided verbal consent on 07/20/2022 for a virtual visit (video or telephone). Freddy Finner, NP  Date: 07/20/2022 10:17 AM  Virtual Visit via Video Note   I, Freddy Finner, connected with  Raymond Costa  ([id number], 1985/04/21) on 07/20/22 at 10:15 AM EDT by a video-enabled telemedicine application and verified that I am speaking with the correct person using two identifiers.  Location: Patient: Virtual Visit Location  Patient: Home Provider: Virtual Visit Location Provider: Home Office   I discussed the limitations of evaluation and management by telemedicine and the availability of in person appointments. The patient expressed understanding and agreed to proceed.    History of Present Illness: Raymond Costa is a 37 y.o. who identifies as a male who was assigned male at birth, and is being seen today for low back pain with tingling and sensation changes usually with sitting down. Pain occurred after washing car last week, it went away but getting up out of bed he felt tightness above tailbone and the sensation changes. Pain is 9/10. No loss of bowel or bladder.   Problems:  Patient Active Problem List   Diagnosis Date Noted   Morbid obesity (HCC) 07/16/2021   Type 2 diabetes mellitus with hyperglycemia, without long-term current use of insulin (HCC) 04/16/2021   Anxiety about health 04/16/2021   Uncontrolled hypertension 12/19/2018    Allergies:  Allergies  Allergen Reactions   Metformin And Related Hives   Shellfish Allergy Hives   Medications:  Current Outpatient Medications:    amLODipine-olmesartan (AZOR) 10-40 MG tablet, Take 1 tablet by mouth daily., Disp: 90 tablet, Rfl: 3   FLUoxetine (PROZAC) 10 MG capsule, Take 1 capsule (10 mg total) by mouth daily., Disp: 90 capsule, Rfl: 3   glimepiride (AMARYL) 1 MG tablet, TAKE 1 TABLET BY MOUTH DAILY WITH BREAKFAST, Disp: 30 tablet, Rfl: 0   hydrochlorothiazide (HYDRODIURIL) 25 MG tablet, Take 1 tablet (25 mg total) by mouth daily., Disp: 90  tablet, Rfl: 3  Observations/Objective: Patient is well-developed, well-nourished in no acute distress.  Resting comfortably  at home.  Head is normocephalic, atraumatic.  No labored breathing.  Speech is clear and coherent with logical content.  Patient is alert and oriented at baseline.   Inability to fully assess possible injury  Assessment and Plan:  1. Acute bilateral low back pain with  bilateral sciatica  -concern for disc changes needs in person eval -no cauda equina symptoms- just need to rule out nerve changes or injury    Patient acknowledged agreement and understanding of the plan.   Past Medical, Surgical, Social History, Allergies, and Medications have been Reviewed.     Follow Up Instructions: I discussed the assessment and treatment plan with the patient. The patient was provided an opportunity to ask questions and all were answered. The patient agreed with the plan and demonstrated an understanding of the instructions.  A copy of instructions were sent to the patient via MyChart unless otherwise noted below.     The patient was advised to call back or seek an in-person evaluation if the symptoms worsen or if the condition fails to improve as anticipated.  Time:  I spent 4 minutes with the patient via telehealth technology discussing the above problems/concerns.    Freddy Finner, NP

## 2022-07-20 NOTE — Progress Notes (Signed)
Sensation changes with back pain.  ED advised.

## 2022-07-20 NOTE — Patient Instructions (Signed)
  Michael Litter, thank you for joining Freddy Finner, NP for today's virtual visit.  While this provider is not your primary care provider (PCP), if your PCP is located in our provider database this encounter information will be shared with them immediately following your visit.   A [facility] MyChart account gives you access to today's visit and all your visits, tests, and labs performed at [facility] " click here if you don't have a [facility] MyChart account or go to [email or web address]  Consent: (Patient) Raymond Costa provided verbal consent for this virtual visit at the beginning of the encounter.  Current Medications:  Current Outpatient Medications:    amLODipine-olmesartan (AZOR) 10-40 MG tablet, Take 1 tablet by mouth daily., Disp: 90 tablet, Rfl: 3   FLUoxetine (PROZAC) 10 MG capsule, Take 1 capsule (10 mg total) by mouth daily., Disp: 90 capsule, Rfl: 3   glimepiride (AMARYL) 1 MG tablet, TAKE 1 TABLET BY MOUTH DAILY WITH BREAKFAST, Disp: 30 tablet, Rfl: 0   hydrochlorothiazide (HYDRODIURIL) 25 MG tablet, Take 1 tablet (25 mg total) by mouth daily., Disp: 90 tablet, Rfl: 3   Medications ordered in this encounter:  No orders of the defined types were placed in this encounter.    *If you need refills on other medications prior to your next appointment, please contact your pharmacy*  Follow-Up: Call back or seek an in-person evaluation if the symptoms worsen or if the condition fails to improve as anticipated.  [facility] [phone]  Other Instructions   If you have been instructed to have an in-person evaluation today at a local Urgent Care facility, please use the link below. It will take you to a list of all of our available [facility], including address, phone number and hours of operation. Please do not delay care.  [facility]  If you or a family member do not have a primary care provider, use  the link below to schedule a visit and establish care. When you choose a [facility] primary care physician or advanced practice provider, you gain a long-term partner in health. Find a Primary Care Provider  Learn more about [facility]'s in-office and virtual care options: [facility] Get [facility] Now

## 2022-08-11 ENCOUNTER — Encounter (INDEPENDENT_AMBULATORY_CARE_PROVIDER_SITE_OTHER): Payer: Self-pay

## 2022-08-15 ENCOUNTER — Other Ambulatory Visit: Payer: Self-pay | Admitting: Physician Assistant

## 2022-08-17 ENCOUNTER — Other Ambulatory Visit: Payer: Self-pay

## 2022-08-17 ENCOUNTER — Encounter (HOSPITAL_BASED_OUTPATIENT_CLINIC_OR_DEPARTMENT_OTHER): Payer: Self-pay | Admitting: Emergency Medicine

## 2022-08-17 ENCOUNTER — Emergency Department (HOSPITAL_BASED_OUTPATIENT_CLINIC_OR_DEPARTMENT_OTHER): Payer: No Typology Code available for payment source | Admitting: Radiology

## 2022-08-17 ENCOUNTER — Emergency Department (HOSPITAL_BASED_OUTPATIENT_CLINIC_OR_DEPARTMENT_OTHER)
Admission: EM | Admit: 2022-08-17 | Discharge: 2022-08-17 | Disposition: A | Discharge status: Home / Self Care | Payer: No Typology Code available for payment source | Attending: Emergency Medicine | Admitting: Emergency Medicine

## 2022-08-17 ENCOUNTER — Other Ambulatory Visit (HOSPITAL_BASED_OUTPATIENT_CLINIC_OR_DEPARTMENT_OTHER): Payer: Self-pay

## 2022-08-17 DIAGNOSIS — Z79899 Other long term (current) drug therapy: Secondary | ICD-10-CM | POA: Diagnosis not present

## 2022-08-17 DIAGNOSIS — I10 Essential (primary) hypertension: Secondary | ICD-10-CM | POA: Insufficient documentation

## 2022-08-17 DIAGNOSIS — R030 Elevated blood-pressure reading, without diagnosis of hypertension: Secondary | ICD-10-CM

## 2022-08-17 DIAGNOSIS — R072 Precordial pain: Secondary | ICD-10-CM

## 2022-08-17 DIAGNOSIS — R079 Chest pain, unspecified: Secondary | ICD-10-CM | POA: Diagnosis present

## 2022-08-17 LAB — CBC
HCT: 41.6 % (ref 39.0–52.0)
Hemoglobin: 15.4 g/dL (ref 13.0–17.0)
MCH: 29.1 pg (ref 26.0–34.0)
MCHC: 37 g/dL — ABNORMAL HIGH (ref 30.0–36.0)
MCV: 78.5 fL — ABNORMAL LOW (ref 80.0–100.0)
Platelets: 311 10*3/uL (ref 150–400)
RBC: 5.3 MIL/uL (ref 4.22–5.81)
RDW: 13.6 % (ref 11.5–15.5)
WBC: 7.7 10*3/uL (ref 4.0–10.5)
nRBC: 0 % (ref 0.0–0.2)

## 2022-08-17 LAB — BASIC METABOLIC PANEL
Anion gap: 8 (ref 5–15)
BUN: 8 mg/dL (ref 6–20)
CO2: 29 mmol/L (ref 22–32)
Calcium: 9.2 mg/dL (ref 8.9–10.3)
Chloride: 99 mmol/L (ref 98–111)
Creatinine, Ser: 0.89 mg/dL (ref 0.61–1.24)
GFR, Estimated: >60 mL/min (ref >60)
Glucose, Bld: 178 mg/dL — ABNORMAL HIGH (ref 70–99)
Potassium: 3.8 mmol/L (ref 3.5–5.1)
Sodium: 136 mmol/L (ref 135–145)

## 2022-08-17 LAB — TROPONIN I (HIGH SENSITIVITY): Troponin I (High Sensitivity): 4 ng/L (ref <18)

## 2022-08-17 MED ORDER — FAMOTIDINE 20 MG PO TABS
20.0000 mg | ORAL_TABLET | Freq: Once | ORAL | Status: AC
  Administered 2022-08-17: 20 mg via ORAL
  Filled 2022-08-17: qty 1

## 2022-08-17 MED ORDER — ALUM & MAG HYDROXIDE-SIMETH 200-200-20 MG/5ML PO SUSP
30.0000 mL | Freq: Once | ORAL | Status: AC
  Administered 2022-08-17: 30 mL via ORAL
  Filled 2022-08-17: qty 30

## 2022-08-17 MED ORDER — ACETAMINOPHEN 500 MG PO TABS
1000.0000 mg | ORAL_TABLET | Freq: Once | ORAL | Status: AC
  Administered 2022-08-17: 1000 mg via ORAL
  Filled 2022-08-17: qty 2

## 2022-08-17 NOTE — Discharge Instructions (Signed)
It was our pleasure to provide your ER care today - we hope that you feel better.  For chest pain, follow up closely with cardiologist in the next 1-2 weeks - we made referral, and their office should call you with appointment in the next few days.   Your blood pressure is high - continue meds, limit salt intake and follow heart health meal plan, and follow up with primary care doctor in the next couple weeks.   Return to ER if worse, new symptoms, recurrent/persistent chest pain, increased trouble breathing, or other concern.

## 2022-08-17 NOTE — ED Triage Notes (Signed)
Pt arrives to ED with c/o intermittent shooting chest pains x3 days. Pt notes mild epigastric pain. Hcx GERD.

## 2022-08-17 NOTE — ED Notes (Signed)
Patient verbalizes understanding of discharge instructions. Opportunity for questioning and answers were provided. Patient discharged from ED.  °

## 2022-08-17 NOTE — ED Provider Notes (Signed)
[place] EMERGENCY DEPARTMENT AT [facility] Provider Note   CSN: [id number] Arrival date & time: 08/17/22  1304     History  Chief Complaint  Patient presents with   Chest Pain    Raymond Costa is a 37 y.o. male.  Pt with c/o chest pain for past 2-3 days. Sharp pain, at rest, non radiating, not pleuritic. No associated sob, nv or diaphoresis. No palpitations. No hx cad or fam hx premature cad. No exertional chest pain. No unusual doe or fatigue. No cough or uri symptoms. No fever or chills. ?reflux symptoms. No recent surgery, immobility or trauma. No leg pain or swelling. No hx dvt/pe. No chest wall injury or strain. Non smoker.   The history is provided by the patient, medical records and a significant other.  Chest Pain Associated symptoms: no abdominal pain, no back pain, no cough, no fever, no headache, no nausea, no palpitations, no shortness of breath and no vomiting        Home Medications Prior to Admission medications   Medication Sig Start Date End Date Taking? Authorizing Provider  amLODipine-olmesartan (AZOR) 10-40 MG tablet Take 1 tablet by mouth daily. 06/02/22   Allwardt, Crist Infante, PA-C  FLUoxetine (PROZAC) 10 MG capsule Take 1 capsule (10 mg total) by mouth daily. 07/16/21   Allwardt, Crist Infante, PA-C  glimepiride (AMARYL) 1 MG tablet TAKE 1 TABLET BY MOUTH DAILY WITH BREAKFAST 08/16/22   Allwardt, Alyssa M, PA-C  hydrochlorothiazide (HYDRODIURIL) 25 MG tablet Take 1 tablet (25 mg total) by mouth daily. 06/02/22   Allwardt, Crist Infante, PA-C      Allergies    Metformin and related and Shellfish allergy    Review of Systems   Review of Systems  Constitutional:  Negative for fever.  HENT:  Negative for sore throat.   Eyes:  Negative for redness.  Respiratory:  Negative for cough and shortness of breath.   Cardiovascular:  Positive for chest pain. Negative for palpitations and leg swelling.  Gastrointestinal:  Negative for abdominal pain, nausea and  vomiting.  Genitourinary:  Negative for flank pain.  Musculoskeletal:  Negative for back pain and neck pain.  Skin:  Negative for rash.  Neurological:  Negative for headaches.    Physical Exam Updated Vital Signs BP (!) 152/94   Pulse 84   Temp 98.4 F (36.9 C) (Oral)   Resp 13   Ht 1.803 m (5\' 11" )   Wt (!) 165.6 kg   SpO2 100%   BMI 50.91 kg/m  Physical Exam Vitals and nursing note reviewed.  Constitutional:      Appearance: Normal appearance. He is well-developed.  HENT:     Head: Atraumatic.     Nose: Nose normal.     Mouth/Throat:     Mouth: Mucous membranes are moist.  Eyes:     General: No scleral icterus.    Conjunctiva/sclera: Conjunctivae normal.  Neck:     Trachea: No tracheal deviation.  Cardiovascular:     Rate and Rhythm: Normal rate and regular rhythm.     Pulses: Normal pulses.     Heart sounds: Normal heart sounds. No murmur heard.    No friction rub. No gallop.  Pulmonary:     Effort: Pulmonary effort is normal. No accessory muscle usage or respiratory distress.     Breath sounds: Normal breath sounds.  Chest:     Chest wall: No tenderness.  Abdominal:     General: Bowel sounds are normal. There is no  distension.     Palpations: Abdomen is soft. There is no mass.     Tenderness: There is no abdominal tenderness.  Musculoskeletal:        General: No swelling or tenderness.     Cervical back: Normal range of motion and neck supple. No rigidity.     Right lower leg: No edema.     Left lower leg: No edema.  Skin:    General: Skin is warm and dry.     Findings: No rash.  Neurological:     Mental Status: He is alert.     Comments: Alert, speech clear.   Psychiatric:        Mood and Affect: Mood normal.     ED Results / Procedures / Treatments   Labs (all labs ordered are listed, but only abnormal results are displayed) Results for orders placed or performed during the hospital encounter of 08/17/22  Basic metabolic panel  Result Value  Ref Range   Sodium 136 135 - 145 mmol/L   Potassium 3.8 3.5 - 5.1 mmol/L   Chloride 99 98 - 111 mmol/L   CO2 29 22 - 32 mmol/L   Glucose, Bld 178 (H) 70 - 99 mg/dL   BUN 8 6 - 20 mg/dL   Creatinine, Ser 4.40 0.61 - 1.24 mg/dL   Calcium 9.2 8.9 - 10.2 mg/dL   GFR, Estimated >72 >53 mL/min   Anion gap 8 5 - 15  CBC  Result Value Ref Range   WBC 7.7 4.0 - 10.5 K/uL   RBC 5.30 4.22 - 5.81 MIL/uL   Hemoglobin 15.4 13.0 - 17.0 g/dL   HCT 66.4 40.3 - 47.4 %   MCV 78.5 (L) 80.0 - 100.0 fL   MCH 29.1 26.0 - 34.0 pg   MCHC 37.0 (H) 30.0 - 36.0 g/dL   RDW 25.9 56.3 - 87.5 %   Platelets 311 150 - 400 K/uL   nRBC 0.0 0.0 - 0.2 %  Troponin I (High Sensitivity)  Result Value Ref Range   Troponin I (High Sensitivity) 4 <18 ng/L     EKG EKG Interpretation Date/Time:  Tuesday August 17 2022 13:09:17 EDT Ventricular Rate:  96 PR Interval:  164 QRS Duration:  82 QT Interval:  335 QTC Calculation: 424 R Axis:   -34  Text Interpretation: Sinus rhythm Left axis deviation Non-specific ST-t changes Confirmed by Cathren Laine ([id number]) on 08/17/2022 1:18:02 PM  Radiology No results found.  Procedures Procedures    Medications Ordered in ED Medications  alum & mag hydroxide-simeth (MAALOX/MYLANTA) 200-200-20 MG/5ML suspension 30 mL (30 mLs Oral Given 08/17/22 1327)  famotidine (PEPCID) tablet 20 mg (20 mg Oral Given 08/17/22 1327)  acetaminophen (TYLENOL) tablet 1,000 mg (1,000 mg Oral Given 08/17/22 1327)    ED Course/ Medical Decision Making/ A&P                                 Medical Decision Making Problems Addressed: Elevated blood pressure reading: acute illness or injury Essential hypertension: chronic illness or injury with exacerbation, progression, or side effects of treatment that poses a threat to life or bodily functions Precordial chest pain: acute illness or injury with systemic symptoms that poses a threat to life or bodily functions  Amount and/or Complexity of Data  Reviewed Independent Historian:     Details: Fam/fr, hx External Data Reviewed: notes. Labs: ordered. Decision-making details documented in ED Course. Radiology:  ordered and independent interpretation performed. Decision-making details documented in ED Course. ECG/medicine tests: ordered and independent interpretation performed. Decision-making details documented in ED Course.  Risk OTC drugs. Decision regarding hospitalization.   Iv ns. Continuous pulse ox and cardiac monitoring. Labs ordered/sent. Imaging ordered.   Differential diagnosis includes ACS, MSK CP, GI CP, etc. Dispo decision including potential need for admission considered - will get labs and imaging and reassess.   Reviewed nursing notes and prior charts for additional history. External reports reviewed. Additional history from: family/friend.   Cardiac monitor: sinus rhythm, rate 80.  Labs reviewed/interpreted by me - trop normal, after symptoms present for past 2-3 days, felt not c/w acs.   Xrays reviewed/interpreted by me - no pna.   Pepcid/maalox, acetaminophen for symptom relief.   Pt comfortable appearing, no distress, no increased wob - pt appears stable for d/c.   Rec close pcp f/u.  Return precautions provided.          Final Clinical Impression(s) / ED Diagnoses Final diagnoses:  Precordial chest pain  Elevated blood pressure reading  Essential hypertension    Rx / DC Orders ED Discharge Orders          Ordered    Ambulatory referral to Cardiology       Comments: If you have not heard from the Cardiology office within the next 72 hours please call [phone].   08/17/22 1422              Cathren Laine, MD 08/17/22 1423

## 2022-09-02 ENCOUNTER — Ambulatory Visit: Payer: No Typology Code available for payment source | Admitting: Physician Assistant

## 2022-09-08 ENCOUNTER — Ambulatory Visit (INDEPENDENT_AMBULATORY_CARE_PROVIDER_SITE_OTHER): Payer: No Typology Code available for payment source | Admitting: Physician Assistant

## 2022-09-08 VITALS — BP 148/102 | HR 81 | Temp 98.4°F | Ht 71.0 in | Wt 373.8 lb

## 2022-09-08 DIAGNOSIS — I10 Essential (primary) hypertension: Secondary | ICD-10-CM | POA: Diagnosis not present

## 2022-09-08 DIAGNOSIS — R079 Chest pain, unspecified: Secondary | ICD-10-CM | POA: Diagnosis not present

## 2022-09-08 DIAGNOSIS — E1165 Type 2 diabetes mellitus with hyperglycemia: Secondary | ICD-10-CM

## 2022-09-08 DIAGNOSIS — Z20822 Contact with and (suspected) exposure to covid-19: Secondary | ICD-10-CM

## 2022-09-08 DIAGNOSIS — Z7984 Long term (current) use of oral hypoglycemic drugs: Secondary | ICD-10-CM

## 2022-09-08 LAB — POC COVID19 BINAXNOW: SARS Coronavirus 2 Ag: NEGATIVE

## 2022-09-08 LAB — POCT GLYCOSYLATED HEMOGLOBIN (HGB A1C): Hemoglobin A1C: 8.8 % — AB (ref 4.0–5.6)

## 2022-09-08 MED ORDER — DAPAGLIFLOZIN PROPANEDIOL 5 MG PO TABS
5.0000 mg | ORAL_TABLET | Freq: Every day | ORAL | 3 refills | Status: AC
Start: 2022-09-08 — End: 2022-12-07

## 2022-09-08 NOTE — Assessment & Plan Note (Signed)
No pain since ER visit He does have appt with cardiology coming up in October   Understands if any red flags - goes back to ER

## 2022-09-08 NOTE — Progress Notes (Signed)
Subjective:    Patient ID: Raymond Costa, male    DOB: Jan 06, 1986, 37 y.o.   MRN: [id number]  Chief Complaint  Patient presents with   Follow-up    3 month f/u. Pt c/o of headaches this morning, did mention a couple of people tested positive for COVID and wanted to see if he could get tested. Pt states he stop taking mounjaro because he was feeling constipated, and just sick to his stomach.     Diabetes   Patient is in today for follow-up T2DM, HTN. See A/P.    Past Medical History:  Diagnosis Date   Anxiety    GERD (gastroesophageal reflux disease)    Hypertension    Obesity    Sleep apnea    Not on cpap machine     Past Surgical History:  Procedure Laterality Date   arm fracture surgery Left    37 years old     Family History  Problem Relation Age of Onset   Hypertension Mother    Diabetes Mother    Cancer Father        leukemia   Prostate cancer Maternal Grandfather    Colon cancer Neg Hx    Esophageal cancer Neg Hx     Social History   Tobacco Use   Smoking status: Never   Smokeless tobacco: Never  Vaping Use   Vaping status: Never Used  Substance Use Topics   Alcohol use: Not Currently    Comment: ocassionally   Drug use: Never     Allergies  Allergen Reactions   Metformin And Related Hives   Shellfish Allergy Hives    Review of Systems NEGATIVE UNLESS OTHERWISE INDICATED IN HPI      Objective:     BP (!) 148/102 (BP Location: Left Arm, Patient Position: Sitting, Cuff Size: Normal)   Pulse 81   Temp 98.4 F (36.9 C) (Temporal)   Ht 5\' 11"  (1.803 m)   Wt (!) 373 lb 12.8 oz (169.6 kg)   SpO2 96%   BMI 52.13 kg/m   Wt Readings from Last 3 Encounters:  09/08/22 (!) 373 lb 12.8 oz (169.6 kg)  08/17/22 (!) 365 lb (165.6 kg)  06/02/22 (!) 356 lb 6.4 oz (161.7 kg)    BP Readings from Last 3 Encounters:  09/08/22 (!) 148/102  08/17/22 (!) 152/94  06/02/22 (!) 156/102     Physical Exam Vitals and nursing note reviewed.   Constitutional:      General: He is not in acute distress.    Appearance: Normal appearance. He is morbidly obese. He is not toxic-appearing.  HENT:     Head: Normocephalic and atraumatic.     Mouth/Throat:     Mouth: Mucous membranes are moist.  Eyes:     Extraocular Movements: Extraocular movements intact.     Conjunctiva/sclera: Conjunctivae normal.     Pupils: Pupils are equal, round, and reactive to light.  Cardiovascular:     Rate and Rhythm: Normal rate and regular rhythm.     Pulses: Normal pulses.     Heart sounds: Normal heart sounds.  Pulmonary:     Effort: Pulmonary effort is normal.     Breath sounds: Normal breath sounds.  Musculoskeletal:        General: Normal range of motion.     Cervical back: Normal range of motion and neck supple.     Right lower leg: No edema.     Left lower leg: No edema.  Skin:  General: Skin is warm and dry.  Neurological:     General: No focal deficit present.     Mental Status: He is alert and oriented to person, place, and time.  Psychiatric:        Mood and Affect: Mood normal.        Behavior: Behavior normal.        Thought Content: Thought content normal.        Assessment & Plan:  Type 2 diabetes mellitus with hyperglycemia, without long-term current use of insulin (HCC) Assessment & Plan: Lab Results  Component Value Date   HGBA1C 8.8 (A) 09/08/2022   HGBA1C 9.7 (A) 06/02/2022   HGBA1C 8.1 (H) 12/17/2021   Since last visit, had started back on Mounjaro, but was causing bloating and constipation - sick to his stomach, took it for 3 weeks, then didn't take the 4th week.  -Lifestyle starting to slowly improve; mom's one year anniversary death coming up - still stressful and hard --Continue Glimepiride 1 mg daily -STOP Mounjaro -START Farxiga 5 mg Pt aware of risks vs benefits and possible adverse reactions   He regular appts with eye doctor -  hx of keratoconus.  Denies any changes or problems with his vision at  this time.   Orders: -     POCT glycosylated hemoglobin (Hb A1C) -     Dapagliflozin Propanediol; Take 1 tablet (5 mg total) by mouth daily before breakfast.  Dispense: 90 tablet; Refill: 3  Uncontrolled hypertension Assessment & Plan: Elevated BP today, some WCS history contributing  Continue AZOR 10-40 mg, which is amlodipine-olmesartan combination.  Continue hydrochlorothiazide 25 mg. Monitor at home. Goal is 130/80. Will have him evaluated with cardiology as well - appt upcoming.   Chest pain, unspecified type Assessment & Plan: No pain since ER visit He does have appt with cardiology coming up in October   Understands if any red flags - goes back to ER    Exposure to COVID-19 virus -     POC COVID-19 BinaxNow  --COVID test negative; reassured; will monitor / call if any changes or symptoms arise       Return in about 3 months (around 12/09/2022) for recheck/follow-up.     Janeth Terry M Whitlee Sluder, PA-C

## 2022-09-08 NOTE — Assessment & Plan Note (Addendum)
Lab Results  Component Value Date   HGBA1C 8.8 (A) 09/08/2022   HGBA1C 9.7 (A) 06/02/2022   HGBA1C 8.1 (H) 12/17/2021   Since last visit, had started back on Mounjaro, but was causing bloating and constipation - sick to his stomach, took it for 3 weeks, then didn't take the 4th week.  -Lifestyle starting to slowly improve; mom's one year anniversary death coming up - still stressful and hard --Continue Glimepiride 1 mg daily -STOP Mounjaro -START Farxiga 5 mg Pt aware of risks vs benefits and possible adverse reactions   He regular appts with eye doctor -  hx of keratoconus.  Denies any changes or problems with his vision at this time.

## 2022-09-08 NOTE — Assessment & Plan Note (Addendum)
Elevated BP today, some WCS history contributing  Continue AZOR 10-40 mg, which is amlodipine-olmesartan combination.  Continue hydrochlorothiazide 25 mg. Monitor at home. Goal is 130/80. Will have him evaluated with cardiology as well - appt upcoming.

## 2022-09-14 ENCOUNTER — Other Ambulatory Visit: Payer: Self-pay | Admitting: Physician Assistant

## 2022-09-28 ENCOUNTER — Other Ambulatory Visit: Payer: Self-pay | Admitting: Physician Assistant

## 2022-09-28 DIAGNOSIS — R4589 Other symptoms and signs involving emotional state: Secondary | ICD-10-CM

## 2022-10-10 ENCOUNTER — Other Ambulatory Visit: Payer: Self-pay | Admitting: Physician Assistant

## 2022-10-21 ENCOUNTER — Encounter: Payer: Self-pay | Admitting: Cardiology

## 2022-10-21 NOTE — Progress Notes (Signed)
Cardiology Office Note   Date:  10/22/2022   ID:  Mallory Enriques, DOB 1985-10-29, MRN [id number]  PCP:  Bary Leriche, PA-C  Cardiologist:   None Referring:  Allwardt, Crist Infante, PA-C  Chief Complaint  Patient presents with   Chest Pain      History of Present Illness: Stefon Ramthun is a 37 y.o. male who presents for evaluation of chest pain.     He was in the ED in August with chest pain.  I reviewed these records for this visit.     There was no evidence of ischemia.  Chest discomfort had started the night before he presented.  It was mid chest.  He thought it was different than his reflux.  It was 7-8 out of 10 in intensity.  He felt like he was nauseated and wanting to throw up.  It helped a little if he leaned forward.  He did not have any radiation to his jaw or to his arms.  He tried to take Tums it did not help.  It was not positional and did not get worse with inspiration.  He was not short of breath particularly.  He did not break out in a bad sweat.  It went on all night and then he went to the emergency room.  He has not really had any since then.  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  He does not bring on any symptoms of this.  He has not had any prior cardiac testing or workup.  Interestingly his mother died about a year ago with sudden death and there was no autopsy.  She was at church when it happened.  Past Medical History:  Diagnosis Date   Anxiety    Diabetes (HCC)    GERD (gastroesophageal reflux disease)    Hypertension    Obesity    Sleep apnea    Not on cpap machine     Past Surgical History:  Procedure Laterality Date   Arm surgery     Fracture repair      Current Outpatient Medications  Medication Sig Dispense Refill   amLODipine-olmesartan (AZOR) 10-40 MG tablet Take 1 tablet by mouth daily. 90 tablet 3   dapagliflozin propanediol (FARXIGA) 5 MG TABS tablet Take 1 tablet (5 mg total) by mouth daily before  breakfast. 90 tablet 3   FLUoxetine (PROZAC) 10 MG capsule TAKE 1 CAPSULE BY MOUTH DAILY 30 capsule 0   glimepiride (AMARYL) 1 MG tablet TAKE 1 TABLET BY MOUTH DAILY WITH BREAKFAST 30 tablet 0   hydrochlorothiazide (HYDRODIURIL) 25 MG tablet Take 1 tablet (25 mg total) by mouth daily. 90 tablet 3   No current facility-administered medications for this visit.    Allergies:   Metformin and related and Shellfish allergy    Social History:  The patient  reports that he has never smoked. He has never used smokeless tobacco. He reports that he does not currently use alcohol. He reports that he does not use drugs.   Family History:  The patient's family history includes Cancer in his father; Diabetes in his mother; Hypertension in his mother; Prostate cancer in his maternal grandfather; Sudden death (age of onset: 30) in his mother.    ROS:  Please see the history of present illness.   Otherwise, review of systems are positive for none.   All other systems are reviewed and negative.    PHYSICAL EXAM: VS:  BP (!) 132/92 (BP Location:  Right Arm, Patient Position: Sitting, Cuff Size: Large)   Pulse 83   Ht 5\' 11"  (1.803 m)   Wt (!) 365 lb 12.8 oz (165.9 kg)   SpO2 99%   BMI 51.02 kg/m  , BMI Body mass index is 51.02 kg/m. GENERAL:  Well appearing HEENT:  Pupils equal round and reactive, fundi not visualized, oral mucosa unremarkable NECK:  No jugular venous distention, waveform within normal limits, carotid upstroke brisk and symmetric, no bruits, no thyromegaly LYMPHATICS:  No cervical, inguinal adenopathy LUNGS:  Clear to auscultation bilaterally BACK:  No CVA tenderness CHEST:  Unremarkable HEART:  PMI not displaced or sustained,S1 and S2 within normal limits, no S3, no S4, no clicks, no rubs, justno murmurs ABD:  Flat, positive bowel sounds normal in frequency in pitch, no bruits, no rebound, no guarding, no midline pulsatile mass, no hepatomegaly, no splenomegaly EXT:  2 plus pulses  throughout, no edema, no cyanosis no clubbing SKIN:  No rashes no nodules NEURO:  Cranial nerves II through XII grossly intact, motor grossly intact throughout [employer]:  Cognitively intact, oriented to person place and time    EKG: 08/17/2022 sinus rhythm, rate 96, left axis deviation, no acute ST-T wave changes, nonspecific T wave flattening.    Recent Labs: 06/10/2022: ALT 94 08/17/2022: BUN 8; Creatinine, Ser 0.89; Hemoglobin 15.4; Platelets 311; Potassium 3.8; Sodium 136    Lipid Panel    Component Value Date/Time   CHOL 135 12/17/2021 1420   TRIG 159.0 (H) 12/17/2021 1420   HDL 33.20 (L) 12/17/2021 1420   CHOLHDL 4 12/17/2021 1420   VLDL 31.8 12/17/2021 1420   LDLCALC 70 12/17/2021 1420   LDLDIRECT 75.0 01/19/2021 0823      Wt Readings from Last 3 Encounters:  10/22/22 (!) 365 lb 12.8 oz (165.9 kg)  09/08/22 (!) 373 lb 12.8 oz (169.6 kg)  08/17/22 (!) 365 lb (165.6 kg)      Other studies Reviewed: Additional studies/ records that were reviewed today include: ED records. Review of the above records demonstrates:  Please see elsewhere in the note.     ASSESSMENT AND PLAN:  CHEST PAIN: Chest discomfort has nonanginal features predominantly.  I would suspect more GI etiology.  At this point I think the pretest probability of obstructive coronary disease is low.  No further testing.  He should come back and see me if he starts getting recurrent chest discomfort.  OBESITY: We had a long talk about the Mediterranean diet.  Of note he did not tolerate Mounjaro previously.  HTN: His blood pressure is not really at target.  I discussed getting a blood pressure cuff and he does have a wrist cuff.  The goal should be 120s over 70s.  I would suggest continue the meds as listed and work on weight loss.  SLEEP APNEA: His STOP-BANG is at least 6.  He had a previous diagnosis of sleep apnea and CPAP but when Medicaid stopped as he got older out of teenage years his CPAP stopped.  He  needs a repeat sleep split-night study.  DM:   He is being managed for this.  Current medicines are reviewed at length with the patient today.  The patient does not have concerns regarding medicines.  The following changes have been made:  no change  Labs/ tests ordered today include:   Orders Placed This Encounter  Procedures   Split night study     Disposition:   FU with with me as needed.  Signed, Rollene Rotunda, MD  10/22/2022 8:33 AM    [facility]

## 2022-10-22 ENCOUNTER — Ambulatory Visit: Payer: No Typology Code available for payment source | Attending: Cardiology | Admitting: Cardiology

## 2022-10-22 ENCOUNTER — Encounter: Payer: Self-pay | Admitting: Cardiology

## 2022-10-22 VITALS — BP 132/92 | HR 83 | Ht 71.0 in | Wt 365.8 lb

## 2022-10-22 DIAGNOSIS — R0683 Snoring: Secondary | ICD-10-CM

## 2022-10-22 NOTE — Patient Instructions (Signed)
Testing/Procedures: Your physician has recommended that you have a sleep study. This test records several body functions during sleep, including: brain activity, eye movement, oxygen and carbon dioxide blood levels, heart rate and rhythm, breathing rate and rhythm, the flow of air through your mouth and nose, snoring, body muscle movements, and chest and belly movement.  This will be done at [facility]; they will call you to schedule it.   Follow-Up: At [facility], you and your health needs are our priority.  As part of our continuing mission to provide you with exceptional heart care, we have created designated Provider Care Teams.  These Care Teams include your primary Cardiologist (physician) and Advanced Practice Providers (APPs -  Physician Assistants and Nurse Practitioners) who all work together to provide you with the care you need, when you need it.  We recommend signing up for the patient portal called "MyChart".  Sign up information is provided on this After Visit Summary.  MyChart is used to connect with patients for Virtual Visits (Telemedicine).  Patients are able to view lab/test results, encounter notes, upcoming appointments, etc.  Non-urgent messages can be sent to your provider as well.   To learn more about what you can do with MyChart, go to [email or web address].    Your next appointment:   As needed   Provider:   Dr. Rollene Rotunda

## 2022-10-31 ENCOUNTER — Other Ambulatory Visit: Payer: Self-pay | Admitting: Physician Assistant

## 2022-10-31 DIAGNOSIS — R4589 Other symptoms and signs involving emotional state: Secondary | ICD-10-CM

## 2022-11-07 ENCOUNTER — Other Ambulatory Visit: Payer: Self-pay | Admitting: Physician Assistant

## 2022-11-09 ENCOUNTER — Telehealth: Payer: Self-pay

## 2022-11-09 NOTE — Telephone Encounter (Addendum)
**Note De-Identified Waldine Zenz Obfuscation** Per the [facility] Provider Portal: The PENDING Split Night Sleep Study prior authorization/notification reference number is: [id number]. The prior authorization/notification case information was transmitted on 11/09/2022 at 11:11 AM CDT.

## 2022-11-19 NOTE — Telephone Encounter (Signed)
**Note De-Identified Cinde Ebert Obfuscation** Per [facility] provider portal: APPROVED: Service requested is covered by your plan Member name: Khyon Sewer Authorization #: [id number] Provider/health care professional: [facility] Service(s) approved:  Procedure code: 93235 o Procedure description: Polysomnography; age 36 years or older, sleep staging with 4 or more additional parameters of sleep, with initiation of continuous positive airway pressure therapy or bilevel ventilation, attended by a technologist  Date(s) of service: 11/18/2022 to 01/11/2023

## 2022-12-04 ENCOUNTER — Other Ambulatory Visit: Payer: Self-pay | Admitting: Physician Assistant

## 2022-12-04 DIAGNOSIS — R4589 Other symptoms and signs involving emotional state: Secondary | ICD-10-CM

## 2022-12-13 ENCOUNTER — Ambulatory Visit: Payer: Self-pay | Admitting: Physician Assistant

## 2022-12-20 ENCOUNTER — Ambulatory Visit (INDEPENDENT_AMBULATORY_CARE_PROVIDER_SITE_OTHER): Payer: No Typology Code available for payment source | Admitting: Physician Assistant

## 2022-12-20 VITALS — BP 152/98 | HR 80 | Temp 97.5°F | Resp 16 | Ht 71.0 in | Wt 365.0 lb

## 2022-12-20 DIAGNOSIS — E1165 Type 2 diabetes mellitus with hyperglycemia: Secondary | ICD-10-CM | POA: Diagnosis not present

## 2022-12-20 DIAGNOSIS — R21 Rash and other nonspecific skin eruption: Secondary | ICD-10-CM

## 2022-12-20 DIAGNOSIS — R4589 Other symptoms and signs involving emotional state: Secondary | ICD-10-CM

## 2022-12-20 DIAGNOSIS — I10 Essential (primary) hypertension: Secondary | ICD-10-CM

## 2022-12-20 DIAGNOSIS — Z7984 Long term (current) use of oral hypoglycemic drugs: Secondary | ICD-10-CM | POA: Diagnosis not present

## 2022-12-20 LAB — POCT GLYCOSYLATED HEMOGLOBIN (HGB A1C)
HbA1c POC (<> result, manual entry): 9.7 % (ref 4.0–5.6)
Hemoglobin A1C: 9.7 % — AB (ref 4.0–5.6)

## 2022-12-20 MED ORDER — KETOCONAZOLE 2 % EX CREA
1.0000 | TOPICAL_CREAM | Freq: Every day | CUTANEOUS | 0 refills | Status: DC
Start: 2022-12-20 — End: 2023-09-01

## 2022-12-20 MED ORDER — FLUOXETINE HCL 10 MG PO CAPS
10.0000 mg | ORAL_CAPSULE | Freq: Every day | ORAL | 1 refills | Status: DC
Start: 2022-12-20 — End: 2023-06-01

## 2022-12-20 MED ORDER — AMLODIPINE-OLMESARTAN 10-40 MG PO TABS
1.0000 | ORAL_TABLET | Freq: Every day | ORAL | 3 refills | Status: DC
Start: 2022-12-20 — End: 2023-06-01

## 2022-12-20 MED ORDER — DAPAGLIFLOZIN PROPANEDIOL 10 MG PO TABS
10.0000 mg | ORAL_TABLET | Freq: Every day | ORAL | 1 refills | Status: DC
Start: 2022-12-20 — End: 2023-06-01

## 2022-12-20 MED ORDER — GLIMEPIRIDE 2 MG PO TABS: 2.0000 mg | ORAL_TABLET | Freq: Every day | ORAL | 1 refills | Status: DC

## 2022-12-20 MED ORDER — GLIMEPIRIDE 1 MG PO TABS
1.0000 mg | ORAL_TABLET | Freq: Every day | ORAL | 1 refills | Status: DC
Start: 2022-12-20 — End: 2022-12-20

## 2022-12-20 MED ORDER — HYDROCHLOROTHIAZIDE 25 MG PO TABS: 25.0000 mg | ORAL_TABLET | Freq: Every day | ORAL | 3 refills | Status: DC

## 2022-12-20 NOTE — Addendum Note (Signed)
Addended by: Wilford Corner on: 12/20/2022 10:52 AM   Modules accepted: Orders

## 2022-12-20 NOTE — Progress Notes (Signed)
Patient ID: Raymond Costa, male    DOB: 27-Jan-1985, 37 y.o.   MRN: [id number]   Assessment & Plan:  Rash and nonspecific skin eruption -     Ketoconazole; Apply 1 Application topically daily.  Dispense: 30 g; Refill: 0  Type 2 diabetes mellitus with hyperglycemia, without long-term current use of insulin (HCC) -     Dapagliflozin Propanediol; Take 1 tablet (10 mg total) by mouth daily.  Dispense: 90 tablet; Refill: 1 -     Amb Referral to Nutrition and Diabetic Education -     Glimepiride; Take 1 tablet (2 mg total) by mouth daily before breakfast.  Dispense: 90 tablet; Refill: 1  Uncontrolled hypertension -     amLODIPine-Olmesartan; Take 1 tablet by mouth daily.  Dispense: 90 tablet; Refill: 3 -     hydroCHLOROthiazide; Take 1 tablet (25 mg total) by mouth daily.  Dispense: 90 tablet; Refill: 3  Morbid obesity (HCC)  Anxiety about health -     FLUoxetine HCl; Take 1 capsule (10 mg total) by mouth daily.  Dispense: 90 capsule; Refill: 1    Assessment and Plan    Type 2 Diabetes Mellitus A1c increased to 9.7 from 8.8 three months ago. Patient is currently on Farxiga 5mg  and Glimepiride 1mg . Discussed the importance of nutrition and exercise in managing diabetes. Also discussed the potential need for insulin if A1c continues to increase. -Increase Farxiga to 10mg  daily. -Increase Glimepiride to 2mg  daily. -Refer to a nutritionist for dietary counseling. -Check A1c in three months.  Sleep Apnea Patient reports cardiologist recommended a sleep study. Patient is awaiting a call to schedule the study. -Ensure sleep study is scheduled and completed.  Foot Dermatitis Patient reports dry skin and sores on one foot. No signs of infection or neuropathy noted on examination. -Prescribe Ketoconazole cream for possible athlete's foot. -Advise patient to monitor feet closely for any changes or worsening of symptoms.  Eye Pain Patient reports intermittent eye pain. No signs of  infection or inflammation noted on examination. -Advise patient to wash eyes thoroughly and consider contacting eye doctor if symptoms persist.  Anxiety Patient reports intermittent anxiety. Currently on Fluoxetine. -Continue Fluoxetine. Monitor and manage as needed.  Obesity Discussed the potential benefits of bariatric surgery. Patient prefers to manage weight through diet and exercise at this time. -Continue to monitor weight and discuss management options as needed.  Hypertension Managed by cardiologist. No changes recommended at this time. -Continue current management.  General Health Maintenance / Followup Plans -Check A1c in three months. -Ensure sleep study is scheduled and completed. -Continue to monitor weight and discuss management options as needed.          Return in about 3 months (around 03/20/2023) for recheck/follow-up.    Subjective:    Chief Complaint  Patient presents with   Diabetes    Here for follow up   Hypertension    Here for follow up    Diabetes  Hypertension   Discussed the use of AI scribe software for clinical note transcription with the patient, who gave verbal consent to proceed.  History of Present Illness   The patient, with a history of diabetes and hypertension, presents with concerns about elevated A1c levels, weight management, and potential sleep apnea. 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 The patient acknowledges recent weight gain and expresses a desire to improve dietary habits. The patient also mentions a  minor eye issue, characterized by occasional pain and morning crusting, but denies any changes in vision. The patient's current medication regimen includes Farxiga and glimepiride for diabetes, and fluoxetine for mood management. The patient is compliant with the medication regimen but expresses a strong aversion  to the idea of insulin therapy or surgery.       Past Medical History:  Diagnosis Date   Anxiety    Diabetes (HCC)    GERD (gastroesophageal reflux disease)    Hypertension    Obesity    Sleep apnea    Not on cpap machine     Past Surgical History:  Procedure Laterality Date   Arm surgery     Fracture repair    Family History  Problem Relation Age of Onset   Hypertension Mother    Diabetes Mother    Sudden death Mother 102   Cancer Father        leukemia   Prostate cancer Maternal Grandfather    Colon cancer Neg Hx    Esophageal cancer Neg Hx     Social History   Tobacco Use   Smoking status: Never   Smokeless tobacco: Never  Vaping Use   Vaping status: Never Used  Substance Use Topics   Alcohol use: Not Currently    Comment: ocassionally   Drug use: Never     Allergies  Allergen Reactions   Metformin And Related Hives   Shellfish Allergy Hives    Review of Systems NEGATIVE UNLESS OTHERWISE INDICATED IN HPI      Objective:     BP (!) 152/98 (BP Location: Left Arm, Patient Position: Sitting, Cuff Size: Large)   Pulse 80   Temp (!) 97.5 F (36.4 C) (Temporal)   Resp 16   Ht 5\' 11"  (1.803 m)   Wt (!) 365 lb (165.6 kg)   SpO2 98%   BMI 50.91 kg/m   Wt Readings from Last 3 Encounters:  12/20/22 (!) 365 lb (165.6 kg)  10/22/22 (!) 365 lb 12.8 oz (165.9 kg)  09/08/22 (!) 373 lb 12.8 oz (169.6 kg)    BP Readings from Last 3 Encounters:  12/20/22 (!) 152/98  10/22/22 (!) 132/92  09/08/22 (!) 148/102     Physical Exam Vitals and nursing note reviewed.  Constitutional:      General: He is not in acute distress.    Appearance: Normal appearance. He is morbidly obese. He is not toxic-appearing.  HENT:     Head: Normocephalic and atraumatic.     Mouth/Throat:     Mouth: Mucous membranes are moist.  Eyes:     Extraocular Movements: Extraocular movements intact.     Conjunctiva/sclera: Conjunctivae normal.     Pupils: Pupils are equal,  round, and reactive to light.  Cardiovascular:     Rate and Rhythm: Normal rate and regular rhythm.     Pulses: Normal pulses.     Heart sounds: Normal heart sounds.  Pulmonary:     Effort: Pulmonary effort is normal.     Breath sounds: Normal breath sounds.  Musculoskeletal:        General: Normal range of motion.     Cervical back: Normal range of motion and neck supple.     Right lower leg: No edema.     Left lower leg: No edema.  Feet:     Right foot:     Skin integrity: Skin breakdown (right medial foot there is a dry patch with minimal erythema, possibly tinea pedis) and  dry skin present.     Toenail Condition: Right toenails are normal.     Left foot:     Skin integrity: Dry skin present.     Toenail Condition: Left toenails are normal.     Comments: Monofilament normal bilateral feet  Skin:    General: Skin is warm and dry.  Neurological:     General: No focal deficit present.     Mental Status: He is alert and oriented to person, place, and time.  Psychiatric:        Mood and Affect: Mood normal.        Behavior: Behavior normal.        Thought Content: Thought content normal.            Susy Placzek M Daielle Melcher, PA-C

## 2023-01-05 ENCOUNTER — Other Ambulatory Visit: Payer: Self-pay | Admitting: Physician Assistant

## 2023-01-11 ENCOUNTER — Ambulatory Visit: Payer: No Typology Code available for payment source | Admitting: Skilled Nursing Facility1

## 2023-03-02 ENCOUNTER — Encounter: Payer: Self-pay | Admitting: Physician Assistant

## 2023-03-02 ENCOUNTER — Telehealth (INDEPENDENT_AMBULATORY_CARE_PROVIDER_SITE_OTHER): Payer: No Typology Code available for payment source | Admitting: Physician Assistant

## 2023-03-02 VITALS — BP 134/93

## 2023-03-02 DIAGNOSIS — Z7984 Long term (current) use of oral hypoglycemic drugs: Secondary | ICD-10-CM

## 2023-03-02 DIAGNOSIS — R21 Rash and other nonspecific skin eruption: Secondary | ICD-10-CM

## 2023-03-02 DIAGNOSIS — I10 Essential (primary) hypertension: Secondary | ICD-10-CM

## 2023-03-02 DIAGNOSIS — E1165 Type 2 diabetes mellitus with hyperglycemia: Secondary | ICD-10-CM

## 2023-03-02 MED ORDER — GLIMEPIRIDE 4 MG PO TABS
4.0000 mg | ORAL_TABLET | Freq: Every day | ORAL | 1 refills | Status: DC
Start: 2023-03-02 — End: 2023-06-01

## 2023-03-02 NOTE — Assessment & Plan Note (Signed)
 Lab Results  Component Value Date   HGBA1C 9.7 (A) 12/20/2022   HGBA1C 9.7 12/20/2022   HGBA1C 8.8 (A) 09/08/2022   -Continue Farxiga 10 mg daily -Increase glimepiride (Amaryl) to 4 mg daily  -Continued efforts on lifestyle changes. Recommended Dr. Magnus Sinning with [facility].

## 2023-03-02 NOTE — Assessment & Plan Note (Signed)
-  Improving, working with cardiology; would benefit from sleep study and still working on this.  -Continue AZOR 10-40 mg, which is amlodipine-olmesartan combination.  -Continue hydrochlorothiazide 25 mg. -Monitoring at home. Continued efforts on lifestyle changes. -Goal is 130/80.

## 2023-03-02 NOTE — Progress Notes (Signed)
   Virtual Visit via Video Note  I connected with  Raymond Costa  on 03/02/23 at  9:00 AM EST by a video enabled telemedicine application and verified that I am speaking with the correct person using two identifiers.  Location: Patient: home Provider: Nature conservation officer at [employer] Persons present: Patient and myself   I discussed the limitations of evaluation and management by telemedicine and the availability of in person appointments. The patient expressed understanding and agreed to proceed.   History of Present Illness:  38 yo male presenting for virtual f/up.  Received glucose monitor and blood pressure monitor from program through work. Logs on an app, has been doing better with HTN and DM.   BP has been in 130s/ 80s-90s at home.  Glucose high this morning at 220 due to spaghetti dinner last night.  120s-140s typical in the mornings.  Sometimes forgets the [place] in the evenings.   Has not been able to see nutritionist yet due to work schedule.  Need to check on sleep study status.    Observations/Objective:   Gen: Awake, alert, no acute distress Resp: Breathing is even and non-labored Psych: calm/pleasant demeanor Neuro: Alert and Oriented x 3, + facial symmetry, speech is clear.   Assessment and Plan:  Problem List Items Addressed This Visit       Cardiovascular and Mediastinum   Uncontrolled hypertension   -Improving, working with cardiology; would benefit from sleep study and still working on this.  -Continue AZOR 10-40 mg, which is amlodipine-olmesartan combination.  -Continue hydrochlorothiazide 25 mg. -Monitoring at home. Continued efforts on lifestyle changes. -Goal is 130/80.         Endocrine   Type 2 diabetes mellitus with hyperglycemia, without long-term current use of insulin (HCC) - Primary   Lab Results  Component Value Date   HGBA1C 9.7 (A) 12/20/2022   HGBA1C 9.7 12/20/2022   HGBA1C 8.8 (A) 09/08/2022   -Continue  Farxiga 10 mg daily -Increase glimepiride (Amaryl) to 4 mg daily  -Continued efforts on lifestyle changes. Recommended Dr. Magnus Sinning with [facility].      Relevant Medications   glimepiride (AMARYL) 4 MG tablet     Other   Morbid obesity (HCC)   Down 6 lbs since last visit - congratulated him on efforts. Keep up good work.       Relevant Medications   glimepiride (AMARYL) 4 MG tablet   Other Visit Diagnoses       Rash and nonspecific skin eruption         --Rash on feet as described last visit has resolved with ketoconazole cream.     Follow Up Instructions:  F/up 3-4 months in office with fasting labs.   I discussed the assessment and treatment plan with the patient. The patient was provided an opportunity to ask questions and all were answered. The patient agreed with the plan and demonstrated an understanding of the instructions.   The patient was advised to call back or seek an in-person evaluation if the symptoms worsen or if the condition fails to improve as anticipated.  Oneita Allmon M Alajia Schmelzer, PA-C

## 2023-03-02 NOTE — Assessment & Plan Note (Signed)
 Down 6 lbs since last visit - congratulated him on efforts. Keep up good work.

## 2023-03-21 ENCOUNTER — Ambulatory Visit: Payer: No Typology Code available for payment source | Admitting: Physician Assistant

## 2023-04-19 ENCOUNTER — Other Ambulatory Visit: Payer: Self-pay | Admitting: Physician Assistant

## 2023-04-19 DIAGNOSIS — E1165 Type 2 diabetes mellitus with hyperglycemia: Secondary | ICD-10-CM

## 2023-06-01 ENCOUNTER — Ambulatory Visit (INDEPENDENT_AMBULATORY_CARE_PROVIDER_SITE_OTHER): Payer: No Typology Code available for payment source | Admitting: Physician Assistant

## 2023-06-01 ENCOUNTER — Other Ambulatory Visit

## 2023-06-01 ENCOUNTER — Encounter: Payer: Self-pay | Admitting: Physician Assistant

## 2023-06-01 VITALS — BP 146/98 | HR 90 | Temp 97.3°F | Ht 71.0 in | Wt 365.4 lb

## 2023-06-01 DIAGNOSIS — R5383 Other fatigue: Secondary | ICD-10-CM

## 2023-06-01 DIAGNOSIS — F419 Anxiety disorder, unspecified: Secondary | ICD-10-CM | POA: Diagnosis not present

## 2023-06-01 DIAGNOSIS — B354 Tinea corporis: Secondary | ICD-10-CM

## 2023-06-01 DIAGNOSIS — I10 Essential (primary) hypertension: Secondary | ICD-10-CM | POA: Diagnosis not present

## 2023-06-01 DIAGNOSIS — E1165 Type 2 diabetes mellitus with hyperglycemia: Secondary | ICD-10-CM

## 2023-06-01 DIAGNOSIS — Z7984 Long term (current) use of oral hypoglycemic drugs: Secondary | ICD-10-CM | POA: Diagnosis not present

## 2023-06-01 DIAGNOSIS — F32A Depression, unspecified: Secondary | ICD-10-CM | POA: Diagnosis not present

## 2023-06-01 DIAGNOSIS — Z6841 Body Mass Index (BMI) 40.0 and over, adult: Secondary | ICD-10-CM

## 2023-06-01 HISTORY — DX: Depression, unspecified: F32.A

## 2023-06-01 LAB — COMPREHENSIVE METABOLIC PANEL WITH GFR
ALT: 55 U/L — ABNORMAL HIGH (ref 0–53)
AST: 36 U/L (ref 0–37)
Albumin: 4.3 g/dL (ref 3.5–5.2)
Alkaline Phosphatase: 79 U/L (ref 39–117)
BUN: 12 mg/dL (ref 6–23)
CO2: 28 meq/L (ref 19–32)
Calcium: 9.6 mg/dL (ref 8.4–10.5)
Chloride: 98 meq/L (ref 96–112)
Creatinine, Ser: 0.88 mg/dL (ref 0.40–1.50)
GFR: 110.32 mL/min (ref >60.00)
Glucose, Bld: 167 mg/dL — ABNORMAL HIGH (ref 70–99)
Potassium: 4.1 meq/L (ref 3.5–5.1)
Sodium: 137 meq/L (ref 135–145)
Total Bilirubin: 0.7 mg/dL (ref 0.2–1.2)
Total Protein: 7.4 g/dL (ref 6.0–8.3)

## 2023-06-01 LAB — CBC WITH DIFFERENTIAL/PLATELET
Basophils Absolute: 0.1 10*3/uL (ref 0.0–0.1)
Basophils Relative: 1.1 % (ref 0.0–3.0)
Eosinophils Absolute: 0.2 10*3/uL (ref 0.0–0.7)
Eosinophils Relative: 2.6 % (ref 0.0–5.0)
HCT: 44.8 % (ref 39.0–52.0)
Hemoglobin: 15.8 g/dL (ref 13.0–17.0)
Lymphocytes Relative: 44.4 % (ref 12.0–46.0)
Lymphs Abs: 2.9 10*3/uL (ref 0.7–4.0)
MCHC: 35.4 g/dL (ref 30.0–36.0)
MCV: 81.5 fl (ref 78.0–100.0)
Monocytes Absolute: 0.4 10*3/uL (ref 0.1–1.0)
Monocytes Relative: 6.7 % (ref 3.0–12.0)
Neutro Abs: 3 10*3/uL (ref 1.4–7.7)
Neutrophils Relative %: 45.2 % (ref 43.0–77.0)
Platelets: 299 10*3/uL (ref 150.0–400.0)
RBC: 5.49 Mil/uL (ref 4.22–5.81)
RDW: 14.8 % (ref 11.5–15.5)
WBC: 6.6 10*3/uL (ref 4.0–10.5)

## 2023-06-01 LAB — LIPID PANEL
Cholesterol: 170 mg/dL (ref 0–200)
HDL: 33.4 mg/dL — ABNORMAL LOW (ref >39.00)
LDL Cholesterol: 94 mg/dL (ref 0–99)
NonHDL: 136.83
Total CHOL/HDL Ratio: 5
Triglycerides: 215 mg/dL — ABNORMAL HIGH (ref 0.0–149.0)
VLDL: 43 mg/dL — ABNORMAL HIGH (ref 0.0–40.0)

## 2023-06-01 LAB — MICROALBUMIN / CREATININE URINE RATIO
Creatinine,U: 49.4 mg/dL
Microalb Creat Ratio: UNDETERMINED mg/g (ref 0.0–30.0)
Microalb, Ur: <0.7 mg/dL

## 2023-06-01 LAB — TSH: TSH: 2.09 u[IU]/mL (ref 0.35–5.50)

## 2023-06-01 LAB — TESTOSTERONE: Testosterone: 343.87 ng/dL (ref 300.00–890.00)

## 2023-06-01 MED ORDER — FLUOXETINE HCL 20 MG PO CAPS: 20.0000 mg | ORAL_CAPSULE | Freq: Every morning | ORAL | 1 refills | Status: DC

## 2023-06-01 MED ORDER — DAPAGLIFLOZIN PROPANEDIOL 10 MG PO TABS: 10.0000 mg | ORAL_TABLET | Freq: Every day | ORAL | 1 refills | Status: DC

## 2023-06-01 MED ORDER — TERBINAFINE HCL 250 MG PO TABS: 250.0000 mg | ORAL_TABLET | Freq: Every day | ORAL | 0 refills | Status: AC

## 2023-06-01 MED ORDER — HYDROCHLOROTHIAZIDE 25 MG PO TABS: 25.0000 mg | ORAL_TABLET | Freq: Every day | ORAL | 1 refills | Status: DC

## 2023-06-01 MED ORDER — AMLODIPINE-OLMESARTAN 10-40 MG PO TABS: 1.0000 | ORAL_TABLET | Freq: Every day | ORAL | 1 refills | Status: DC

## 2023-06-01 MED ORDER — GLIMEPIRIDE 4 MG PO TABS: 4.0000 mg | ORAL_TABLET | Freq: Every day | ORAL | 1 refills | Status: DC

## 2023-06-01 NOTE — Progress Notes (Signed)
 Patient ID: Raymond Costa, male    DOB: 1985-03-25, 38 y.o.   MRN: [id number]   Assessment & Plan:  Anxiety and depression -     FLUoxetine  HCl; Take 1 capsule (20 mg total) by mouth every morning.  Dispense: 90 capsule; Refill: 1 -     CBC with Differential/Platelet -     Comprehensive metabolic panel with GFR -     Lipid panel -     TSH -     Hemoglobin A1c -     Testosterone -     Microalbumin / creatinine urine ratio  Type 2 diabetes mellitus with hyperglycemia, without long-term current use of insulin (HCC) -     Dapagliflozin  Propanediol; Take 1 tablet (10 mg total) by mouth daily.  Dispense: 90 tablet; Refill: 1 -     Glimepiride ; Take 1 tablet (4 mg total) by mouth daily with breakfast.  Dispense: 90 tablet; Refill: 1 -     CBC with Differential/Platelet -     Comprehensive metabolic panel with GFR -     Lipid panel -     Hemoglobin A1c -     Microalbumin / creatinine urine ratio  Uncontrolled hypertension -     amLODIPine -Olmesartan ; Take 1 tablet by mouth daily.  Dispense: 90 tablet; Refill: 1 -     hydroCHLOROthiazide ; Take 1 tablet (25 mg total) by mouth daily.  Dispense: 90 tablet; Refill: 1 -     CBC with Differential/Platelet -     Comprehensive metabolic panel with GFR -     Lipid panel -     Hemoglobin A1c  Morbid obesity (HCC) -     CBC with Differential/Platelet -     Comprehensive metabolic panel with GFR -     Lipid panel -     TSH -     Hemoglobin A1c -     Testosterone -     Microalbumin / creatinine urine ratio  Tinea corporis  Other fatigue -     CBC with Differential/Platelet -     Comprehensive metabolic panel with GFR -     TSH -     Hemoglobin A1c -     Testosterone  Other orders -     Terbinafine HCl; Take 1 tablet (250 mg total) by mouth daily.  Dispense: 30 tablet; Refill: 0    Assessment & Plan Depression Depression exacerbated by recent stressors, including the anniversary of his mother's passing and financial stress.  Symptoms persist despite current Prozac  regimen. - Increase Prozac  to 20 mg daily. - Encourage engagement with counseling services, potentially for virtual sessions.  Hypertension Blood pressure remains elevated, likely influenced by stress and inconsistent medication adherence. - Encourage consistent medication adherence. - Promote regular physical activity.  Type 2 Diabetes Mellitus Inconsistent adherence to Farxiga  with periodic monitoring showing decent blood sugar control. - Encourage consistent adherence to Farxiga  10 mg daily. - Monitor blood sugar levels regularly. Lab Results  Component Value Date   HGBA1C 9.1 (H) 06/01/2023   HGBA1C 9.7 (A) 12/20/2022   HGBA1C 9.7 12/20/2022     Tinea (Fungal Skin Infection) Widespread tinea infection on arms, neck, and abdomen. Not itchy, suggesting fungal etiology rather than psoriasis. Diabetes and sweating may contribute to susceptibility. - Prescribe terbinafine 250 mg daily for up to 30 days. - Use anti-dandruff shampoo like Head and Shoulders or Selsun Blue as a topical adjunct. - Order liver function tests after four weeks of treatment.  Return in about 3 months (around 09/01/2023) for recheck/follow-up.    Subjective:    Chief Complaint  Patient presents with   Diabetes    Pt seen in office today for diabetes f/u;     HPI Discussed the use of AI scribe software for clinical note transcription with the patient, who gave verbal consent to proceed.  History of Present Illness Raymond Costa is a 38 year old male who presents with worsening depression and skin changes.  He has been experiencing worsening depression, particularly around significant dates such as his mother's birthday and Mother's Day, which coincide. It has been two years since her passing, but the emotional impact remains strong. He feels overwhelmed by life stressors, including financial issues and family dynamics, which have been exacerbating  his mood. He is more irritable than usual and is currently taking Prozac  but has been inconsistent with therapy due to a busy work schedule.  He is experiencing stress related to his wife's emotional state following two miscarriages, one of which would have resulted in a birth this month. This has been particularly difficult for both of them, especially around Mother's Day. He is seeking further medical evaluation for his wife to understand the underlying causes of the miscarriages.  He has noticed skin changes, described as spotting on both arms, his belly, and neck. These spots are not itchy. 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  He is managing diabetes with glimepiride  4 mg daily and Farxiga  10 mg daily, though he admits to being inconsistent with the latter. He monitors his blood sugar and blood pressure periodically, noting a recent blood sugar level of 131 mg/dL and blood pressure of 161/09 mmHg. He uses an app provided by his job to track these metrics.  His work schedule is demanding, with long hours and limited staffing, which contributes to his stress and impacts his ability to maintain consistent medication and health monitoring.     Past Medical History:  Diagnosis Date   Anxiety    Anxiety and depression 06/01/2023   Diabetes (HCC)    GERD (gastroesophageal reflux disease)    Hypertension    Obesity    Sleep apnea    Not on cpap machine     Past Surgical History:  Procedure Laterality Date   Arm surgery     Fracture repair    Family History  Problem Relation Age of Onset   Hypertension Mother    Diabetes Mother    Sudden death Mother 63   Cancer Father        leukemia   Prostate cancer Maternal Grandfather    Colon cancer Neg Hx    Esophageal cancer Neg Hx     Social History   Tobacco Use   Smoking status: Never   Smokeless tobacco: Never  Vaping Use   Vaping status:  Never Used  Substance Use Topics   Alcohol use: Not Currently    Comment: ocassionally   Drug use: Never     Allergies  Allergen Reactions   Metformin  And Related Hives   Shellfish Allergy Hives    Review of Systems NEGATIVE UNLESS OTHERWISE INDICATED IN HPI      Objective:     BP (!) 146/98 (BP Location: Left Arm, Cuff Size: Large)   Pulse 90   Temp (!) 97.3 F (36.3 C) (Temporal)   Ht 5\' 11"  (1.803 m)   Wt (!) 365 lb  6.4 oz (165.7 kg)   SpO2 97%   BMI 50.96 kg/m   Wt Readings from Last 3 Encounters:  06/01/23 (!) 365 lb 6.4 oz (165.7 kg)  12/20/22 (!) 365 lb (165.6 kg)  10/22/22 (!) 365 lb 12.8 oz (165.9 kg)    BP Readings from Last 3 Encounters:  06/01/23 (!) 146/98  03/02/23 (!) 134/93  12/20/22 (!) 152/98     Physical Exam Vitals and nursing note reviewed.  Constitutional:      Appearance: Normal appearance. He is obese.  Eyes:     Extraocular Movements: Extraocular movements intact.     Conjunctiva/sclera: Conjunctivae normal.     Pupils: Pupils are equal, round, and reactive to light.  Cardiovascular:     Rate and Rhythm: Normal rate and regular rhythm.     Pulses: Normal pulses.     Heart sounds: Normal heart sounds. No murmur heard. Pulmonary:     Effort: Pulmonary effort is normal.     Breath sounds: Normal breath sounds.  Musculoskeletal:     Right lower leg: No edema.     Left lower leg: No edema.  Skin:    Findings: Rash (see photos) present.  Neurological:     Mental Status: He is alert.  Psychiatric:        Mood and Affect: Mood is depressed. Affect is tearful.                    Elisavet Buehrer M Kemari Narez, PA-C

## 2023-06-02 ENCOUNTER — Ambulatory Visit: Payer: Self-pay | Admitting: Physician Assistant

## 2023-06-02 LAB — HEMOGLOBIN A1C
Est. average glucose Bld gHb Est-mCnc: 214 mg/dL
Hgb A1c MFr Bld: 9.1 % — ABNORMAL HIGH (ref 4.8–5.6)

## 2023-06-02 NOTE — Patient Instructions (Signed)
  VISIT SUMMARY: During your visit, we discussed your worsening depression, skin changes, and management of your diabetes and blood pressure. We adjusted your medications and provided recommendations to help manage your conditions.  YOUR PLAN: DEPRESSION: Your depression has been worsening, especially around significant dates and due to life stressors. -Increase Prozac  to 20 mg daily. -Engage with counseling services, potentially for virtual sessions.  HYPERTENSION: Your blood pressure remains elevated, likely due to stress and inconsistent medication adherence. -Encourage consistent medication adherence. -Promote regular physical activity.  TYPE 2 DIABETES MELLITUS: You have been inconsistent with taking Farxiga , but your blood sugar control is decent. -Encourage consistent adherence to Farxiga  10 mg daily. -Monitor blood sugar levels regularly.  TINEA (FUNGAL SKIN INFECTION): You have a widespread fungal skin infection on your arms, neck, and abdomen. -Take terbinafine 250 mg daily for up to 30 days. -Use anti-dandruff shampoo like Head and Shoulders or Selsun Blue as a topical adjunct. -We will order liver function tests after four weeks of treatment.                      Contains text generated by Abridge.                                 Contains text generated by Abridge.

## 2023-07-27 LAB — HM DIABETES EYE EXAM

## 2023-07-29 ENCOUNTER — Telehealth: Payer: Self-pay

## 2023-07-29 NOTE — Telephone Encounter (Signed)
 Patient was identified as falling into the [place] Measure - Diabetes.   Patient was: Appointment already scheduled for:  09/01/23.

## 2023-08-03 ENCOUNTER — Ambulatory Visit: Admitting: Physician Assistant

## 2023-08-10 ENCOUNTER — Ambulatory Visit: Admitting: Physician Assistant

## 2023-08-11 ENCOUNTER — Ambulatory Visit: Admitting: Physician Assistant

## 2023-09-01 ENCOUNTER — Ambulatory Visit: Admitting: Physician Assistant

## 2023-09-01 VITALS — BP 138/86 | HR 79 | Temp 97.3°F | Ht 71.0 in | Wt 363.8 lb

## 2023-09-01 DIAGNOSIS — E1165 Type 2 diabetes mellitus with hyperglycemia: Secondary | ICD-10-CM

## 2023-09-01 DIAGNOSIS — I10 Essential (primary) hypertension: Secondary | ICD-10-CM

## 2023-09-01 LAB — POCT GLYCOSYLATED HEMOGLOBIN (HGB A1C): Hemoglobin A1C: 9.6 % — AB (ref 4.0–5.6)

## 2023-09-01 NOTE — Patient Instructions (Signed)
  VISIT SUMMARY: Today, we discussed your type 2 diabetes management, focusing on your recent A1c levels and lifestyle habits. We also reviewed your blood pressure, which is well-controlled.  YOUR PLAN: TYPE 2 DIABETES MELLITUS, POORLY CONTROLLED: Your A1c level has increased to 9.6, indicating that your diabetes is not well-controlled. This is likely due to your current dietary habits and limited physical activity. -We recommend seeing a nutritionist for dietary counseling to help you make better food choices. -Try meal prepping and planning to improve your lunch options and reduce fast food intake. -Increase your physical activity, including weight training and walking. Aim to be more active throughout the week. -Set a goal to lose 10 pounds over the next three months. -We will schedule a follow-up appointment in three months to reassess your diabetes management.  ESSENTIAL HYPERTENSION, CONTROLLED: Your blood pressure is well-controlled with recent readings around the 120s. -Continue with your current management plan for hypertension.                      Contains text generated by Abridge.                                 Contains text generated by Abridge.

## 2023-09-01 NOTE — Progress Notes (Signed)
 Patient ID: Raymond Costa, male    DOB: [birth year], 38 y.o.   MRN: [id number]   Assessment & Plan:  Type 2 diabetes mellitus with hyperglycemia, without long-term current use of insulin (HCC) -     POCT glycosylated hemoglobin (Hb A1C) -     Amb Referral to Nutrition and Diabetic Education  Essential hypertension      Assessment and Plan Assessment & Plan Type 2 diabetes mellitus, poorly controlled Type 2 diabetes mellitus with a current A1c of 9.6, indicating poor control. Previous A1c was 9.1, showing a worsening trend. Insulin resistance is likely due to dietary habits and sedentary lifestyle. Emphasized the importance of nutrition and exercise in managing diabetes. Discussed the need to reduce carbohydrate intake, particularly from processed foods, and to increase physical activity. Preferred to focus on lifestyle changes first, with a potential endocrinology referral and insulin therapy if control does not improve. - Refer to nutritionist for dietary counseling - Encourage meal prepping and planning to improve lunch choices - Advise increasing physical activity, including weight training and walking - Set a weight loss goal of 10 pounds over the next three months - Schedule follow-up in three months to reassess diabetes management Lab Results  Component Value Date   HGBA1C 9.6 (A) 09/01/2023   HGBA1C 9.1 (H) 06/01/2023   HGBA1C 9.7 (A) 12/20/2022   HGBA1C 9.7 12/20/2022     Essential hypertension, controlled Essential hypertension is currently well-controlled  Azor  10-40 mg, hydrochlorothiazide  25 mg daily Exercise is crucial       Return in about 3 months (around 12/02/2023) for recheck/follow-up.    Subjective:    Chief Complaint  Patient presents with   Diabetes    Pt in office for 3 mon f/u for A1C and diabetes;     HPI Discussed the use of AI scribe software for clinical note transcription with the patient, who gave verbal consent to  proceed.  History of Present Illness Raymond Costa is a 38 year old male with type 2 diabetes who presents for follow-up of elevated A1c levels.  His recent A1c level is 9.6, an increase from 9.1 previously. He attributes this to a sedentary lifestyle and dietary habits, despite efforts to change his eating patterns. He eats only twice a day, with lunch often consisting of fast food. He is currently taking his prescribed medication but did not specify the name or dosage.  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 He works from 9 to 5:30 and tries to walk once or twice a week. He has been doing more physical activity recently to prepare for the football season.  He discusses his dietary habits, mentioning a preference for carbohydrates such as bread, mac and cheese, and pretzels. He has been trying to incorporate more fruits like watermelon and strawberries, and uses [place] yogurt with oats. He acknowledges that his lunch choices are often convenient fast foods.  He recalls that his A1c was previously as low as 7.4 in July 2023, following a diagnosis of 12.9 in January 2023. He attributes the initial improvement to motivation from his mother, who has since passed away, and notes that personal losses, including a miscarriage, have impacted his health management.  His wife is also dealing with health issues, including PCOS and upcoming surgery for fibroids. She is on metformin . He expresses concern for her health and acknowledges the need for both of them to  improve their lifestyle habits.     Past Medical History:  Diagnosis Date   Anxiety    Anxiety and depression 06/01/2023   Diabetes (HCC)    GERD (gastroesophageal reflux disease)    Hypertension    Obesity    Sleep apnea    Not on cpap machine     Past Surgical History:  Procedure Laterality Date   Arm surgery     Fracture repair     Family History  Problem Relation Age of Onset   Hypertension Mother    Diabetes Mother    Sudden death Mother 104   Cancer Father        leukemia   Prostate cancer Maternal Grandfather    Colon cancer Neg Hx    Esophageal cancer Neg Hx     Social History   Tobacco Use   Smoking status: Never   Smokeless tobacco: Never  Vaping Use   Vaping status: Never Used  Substance Use Topics   Alcohol use: Not Currently    Comment: ocassionally   Drug use: Never     Allergies  Allergen Reactions   Metformin  And Related Hives   Shellfish Allergy Hives    Review of Systems NEGATIVE UNLESS OTHERWISE INDICATED IN HPI      Objective:     BP 138/86 (BP Location: Left Arm, Patient Position: Sitting, Cuff Size: Large)   Pulse 79   Temp (!) 97.3 F (36.3 C) (Temporal)   Ht 5' 11 (1.803 m)   Wt (!) 363 lb 12.8 oz (165 kg)   SpO2 98%   BMI 50.74 kg/m   Wt Readings from Last 3 Encounters:  09/01/23 (!) 363 lb 12.8 oz (165 kg)  06/01/23 (!) 365 lb 6.4 oz (165.7 kg)  12/20/22 (!) 365 lb (165.6 kg)    BP Readings from Last 3 Encounters:  09/01/23 138/86  06/01/23 (!) 146/98  03/02/23 (!) 134/93     Physical Exam Vitals and nursing note reviewed.  Constitutional:      General: He is not in acute distress.    Appearance: Normal appearance. He is morbidly obese. He is not toxic-appearing.  HENT:     Head: Normocephalic and atraumatic.  Eyes:     Extraocular Movements: Extraocular movements intact.     Conjunctiva/sclera: Conjunctivae normal.     Pupils: Pupils are equal, round, and reactive to light.  Cardiovascular:     Rate and Rhythm: Normal rate and regular rhythm.     Pulses: Normal pulses.  Pulmonary:     Effort: Pulmonary effort is normal.  Musculoskeletal:        General: Normal range of motion.     Cervical back: Normal range of motion and neck supple.  Skin:    General: Skin is warm and dry.  Neurological:     General: No focal deficit present.      Mental Status: He is alert and oriented to person, place, and time.  Psychiatric:        Mood and Affect: Mood normal.        Behavior: Behavior normal.        Thought Content: Thought content normal.             Alishia Lebo M Nyilah Kight, PA-C

## 2023-09-28 NOTE — Progress Notes (Deleted)
  Start: *** end: *** Patient is here today *** Patient would like to learn *** Patient lives with ***.  *** shopping and cooking.  History includes:  *** Medications include:  *** Labs noted:  ***    9.1%, glimepiride , farxiga

## 2023-09-30 NOTE — Progress Notes (Signed)
 Diabetes Self-Management Education  Visit Type: First/Initial  Appt. Start Time: 0914 Appt. End Time: 1010  10/05/2023  [name], identified by name and date of birth, is a 38 y.o. male with a diagnosis of Diabetes: Type 2.   ASSESSMENT  Patient is here today alone. Patient would like to learn more about eating for diabetes. 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 Pt reports a bad relationship with food stating I don't know what to eat. Pt reports finding dietary information on social media that is conflicting. Pt reports his last blood sugar reading was from last week stating a value of 153 obtained before lunch. Pt reports eating out 7-8 time weekly. Pt reports she selects sugary sweetened daily. All Pt's questions were answered during this encounter.    History includes:   Past Medical History:  Diagnosis Date   Anxiety    Anxiety and depression 06/01/2023   Diabetes (HCC)    GERD (gastroesophageal reflux disease)    Hypertension    Obesity    Sleep apnea    Not on cpap machine     Medications include:   Current Outpatient Medications:    amLODipine -olmesartan  (AZOR ) 10-40 MG tablet, Take 1 tablet by mouth daily., Disp: 90 tablet, Rfl: 1   dapagliflozin  propanediol (FARXIGA ) 10 MG TABS tablet, Take 1 tablet (10 mg total) by mouth daily., Disp: 90 tablet, Rfl: 1   FLUoxetine  (PROZAC ) 20 MG capsule, Take 1 capsule (20 mg total) by mouth every morning., Disp: 90 capsule, Rfl: 1   glimepiride  (AMARYL ) 4 MG tablet, Take 1 tablet (4 mg total) by mouth daily with breakfast., Disp: 90 tablet, Rfl: 1   hydrochlorothiazide  (HYDRODIURIL ) 25 MG tablet, Take 1 tablet (25 mg total) by mouth daily., Disp: 90 tablet, Rfl: 1  Labs noted:   Lab Results  Component Value Date   HGBA1C 9.6 (A) 09/01/2023   Wt Readings from Last 3  Encounters:  09/01/23 (!) 363 lb 12.8 oz (165 kg)  06/01/23 (!) 365 lb 6.4 oz (165.7 kg)  12/20/22 (!) 365 lb (165.6 kg)   There were no vitals taken for this visit. There is no height or weight on file to calculate BMI.   Diabetes Self-Management Education - 10/05/23 0911       Visit Information   Visit Type First/Initial      Initial Visit   Diabetes Type Type 2    Date Diagnosed 2023    Are you currently following a meal plan? No    Are you taking your medications as prescribed? Yes      Health Coping   How would you rate your overall health? Good      Psychosocial Assessment   Patient Belief/Attitude about Diabetes Motivated to manage diabetes    What is the hardest part about your diabetes right now, causing you the most concern, or is the most worrisome to you about your diabetes?   Getting support / problem solving;Making healty food and beverage choices    Self-care barriers None    Self-management support Doctor's office    Other persons present Patient    Patient Concerns Nutrition/Meal planning    Special Needs None    Preferred Learning Style No preference indicated    Learning Readiness Ready    How often do you need to have someone help you when you read  instructions, pamphlets, or other written materials from your doctor or pharmacy? 1 - Never    What is the last grade level you completed in school? 12th      Pre-Education Assessment   Patient understands the diabetes disease and treatment process. Needs Instruction    Patient understands incorporating nutritional management into lifestyle. Needs Instruction    Patient undertands incorporating physical activity into lifestyle. Needs Instruction    Patient understands using medications safely. Needs Instruction    Patient understands monitoring blood glucose, interpreting and using results Needs Instruction    Patient understands prevention, detection, and treatment of acute complications. Needs Instruction     Patient understands prevention, detection, and treatment of chronic complications. Needs Instruction    Patient understands how to develop strategies to address psychosocial issues. Needs Instruction    Patient understands how to develop strategies to promote health/change behavior. Needs Instruction      Complications   Last HgB A1C per patient/outside source 9.3 %    How often do you check your blood sugar? 0 times/day (not testing)    Fasting Blood glucose range (mg/dL) 869-820;819-799    Number of hypoglycemic episodes per month 0    Number of hyperglycemic episodes ( >200mg /dL): Occasional    Can you tell when your blood sugar is high? No    Have you had a dilated eye exam in the past 12 months? Yes    Have you had a dental exam in the past 12 months? Yes    Are you checking your feet? Yes    How many days per week are you checking your feet? 2      Dietary Intake   Breakfast skips, water    Lunch biscuit from [employer] with grilled chicken, egg whites, cheese, small sweet tea, country fries    Dinner 3 hot dog on bun with chilli, cole slaw, onion, ketchup, mustard, mayo, chips 2-3 handfuls, water    Beverage(s) water, sweet tea      Activity / Exercise   Activity / Exercise Type Moderate (swimming / aerobic walking)    How many days per week do you exercise? 4    How many minutes per day do you exercise? 45    Total minutes per week of exercise 180      Patient Education   Previous Diabetes Education No    Disease Pathophysiology Definition of diabetes, type 1 and 2, and the diagnosis of diabetes;Explored patient's options for treatment of their diabetes    Healthy Eating Plate Method;Role of diet in the treatment of diabetes and the relationship between the three main macronutrients and blood glucose level;Reviewed blood glucose goals for pre and post meals and how to evaluate the patients' food intake on their blood glucose level.;Food label reading, portion sizes and  measuring food.;Carbohydrate counting    Being Active Role of exercise on diabetes management, blood pressure control and cardiac health.    Medications Reviewed patients medication for diabetes, action, purpose, timing of dose and side effects.    Monitoring Identified appropriate SMBG and/or A1C goals.    Acute complications Discussed and identified patients' prevention, symptoms, and treatment of hyperglycemia.    Chronic complications Retinopathy and reason for yearly dilated eye exams;Dental care;Relationship between chronic complications and blood glucose control;Assessed and discussed foot care and prevention of foot problems    Diabetes Stress and Support Worked with patient to identify barriers to care and solutions;Identified and addressed patients feelings and concerns about diabetes  Lifestyle and Health Coping Lifestyle issues that need to be addressed for better diabetes care      Individualized Goals (developed by patient)   Nutrition Follow meal plan discussed    Physical Activity Exercise 3-5 times per week;45 minutes per day;Exercise 5-7 days per week    Medications take my medication as prescribed    Monitoring  Test my blood glucose as discussed    Problem Solving Eating Pattern;Addressing barriers to behavior change    Reducing Risk do foot checks daily;examine blood glucose patterns      Post-Education Assessment   Patient understands the diabetes disease and treatment process. Needs Review    Patient understands incorporating nutritional management into lifestyle. Needs Review    Patient undertands incorporating physical activity into lifestyle. Needs Review    Patient understands using medications safely. Needs Review    Patient understands monitoring blood glucose, interpreting and using results Needs Review    Patient understands prevention, detection, and treatment of acute complications. Needs Review    Patient understands prevention, detection, and treatment of  chronic complications. Needs Review    Patient understands how to develop strategies to address psychosocial issues. Needs Review    Patient understands how to develop strategies to promote health/change behavior. Needs Review      Outcomes   Expected Outcomes Demonstrated interest in learning. Expect positive outcomes    Future DMSE 3-4 months    Program Status Not Completed          Individualized Plan for Diabetes Self-Management Training:   Learning Objective:  Patient will have a greater understanding of diabetes self-management. Patient education plan is to attend individual and/or group sessions per assessed needs and concerns.   Plan:   Patient Instructions  Aim to avoid skipping breakfast; select a balanced or meal snack in replace of a skipped meal    Increase physical activity-weight resistance training aiming for evenings for 30 or minutes as tolerated    Expected Outcomes:  Demonstrated interest in learning. Expect positive outcomes  Education material provided: ADA - How to Thrive: A Guide for Your Journey with Diabetes, My Plate, Snack sheet, and Diabetes Resources  If problems or questions, patient to contact team via:  Phone  Future DSME appointment: 3-4 months

## 2023-10-05 ENCOUNTER — Ambulatory Visit: Admitting: Dietician

## 2023-10-05 ENCOUNTER — Encounter: Attending: Physician Assistant | Admitting: Dietician

## 2023-10-05 DIAGNOSIS — E1165 Type 2 diabetes mellitus with hyperglycemia: Secondary | ICD-10-CM | POA: Diagnosis present

## 2023-10-05 NOTE — Patient Instructions (Addendum)
 Aim to avoid skipping breakfast; select a balanced or meal snack in replace of a skipped meal    Increase physical activity-weight resistance training aiming for evenings for 30 or minutes as tolerated

## 2023-12-01 ENCOUNTER — Ambulatory Visit: Admitting: Physician Assistant

## 2023-12-02 ENCOUNTER — Ambulatory Visit: Admitting: Physician Assistant

## 2023-12-05 NOTE — Progress Notes (Deleted)
 Diabetes Self-Management Education  Visit Type:    Appt. Start Time: *** Appt. End Time: ***  12/05/2023  Mr. Ras Kollman, identified by name and date of birth, is a 38 y.o. male with a diagnosis of Diabetes:  .   ASSESSMENT  9/24: Patient is here today alone ***.  Patient would like to learn more about eating for diabetes. 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 Pt reports eating out 7-8 time weekly.   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 Pt reports a bad relationship with food stating I don't know what to eat. Pt reports finding dietary information on social media that is conflicting. Pt reports his last blood sugar reading was from last week stating a value of 153 obtained before lunch.  Pt reports he selects sugary sweetened beverages daily. All Pt's questions were answered during this encounter.    History includes:   Past Medical History:  Diagnosis Date   Anxiety    Anxiety and depression 06/01/2023   Diabetes (HCC)    GERD (gastroesophageal reflux disease)    Hypertension    Obesity    Sleep apnea    Not on cpap machine     Medications include:   Current Outpatient Medications:    amLODipine -olmesartan  (AZOR ) 10-40 MG tablet, Take 1 tablet by mouth daily., Disp: 90 tablet, Rfl: 1   dapagliflozin  propanediol (FARXIGA ) 10 MG TABS tablet, Take 1 tablet (10 mg total) by mouth daily., Disp: 90 tablet, Rfl: 1   FLUoxetine  (PROZAC ) 20 MG capsule, Take 1 capsule (20 mg total) by mouth every morning., Disp: 90 capsule, Rfl: 1   glimepiride  (AMARYL ) 4 MG tablet, Take 1 tablet (4 mg total) by mouth daily with breakfast., Disp: 90 tablet, Rfl: 1   hydrochlorothiazide  (HYDRODIURIL ) 25 MG tablet, Take 1 tablet (25 mg total) by mouth daily., Disp: 90 tablet, Rfl: 1  Labs noted:   Lab Results  Component Value Date   HGBA1C 9.6 (A) 09/01/2023   Wt Readings from Last 3  Encounters:  09/01/23 (!) 363 lb 12.8 oz (165 kg)  06/01/23 (!) 365 lb 6.4 oz (165.7 kg)  12/20/22 (!) 365 lb (165.6 kg)      Individualized Plan for Diabetes Self-Management Training:   Learning Objective:  Patient will have a greater understanding of diabetes self-management. Patient education plan is to attend individual and/or group sessions per assessed needs and concerns.   Plan:   There are no Patient Instructions on file for this visit.  Expected Outcomes:     Education material provided: ADA - How to Thrive: A Guide for Your Journey with Diabetes, My Plate, Snack sheet, and Diabetes Resources  If problems or questions, patient to contact team via:  Phone  Future DSME appointment:

## 2023-12-14 ENCOUNTER — Ambulatory Visit: Admitting: Dietician

## 2023-12-14 DIAGNOSIS — E1165 Type 2 diabetes mellitus with hyperglycemia: Secondary | ICD-10-CM

## 2023-12-24 ENCOUNTER — Other Ambulatory Visit: Payer: Self-pay | Admitting: Physician Assistant

## 2023-12-24 DIAGNOSIS — I10 Essential (primary) hypertension: Secondary | ICD-10-CM

## 2023-12-24 DIAGNOSIS — F419 Anxiety disorder, unspecified: Secondary | ICD-10-CM

## 2023-12-24 DIAGNOSIS — E1165 Type 2 diabetes mellitus with hyperglycemia: Secondary | ICD-10-CM

## 2023-12-26 ENCOUNTER — Other Ambulatory Visit: Payer: Self-pay

## 2023-12-29 ENCOUNTER — Ambulatory Visit: Admitting: Physician Assistant

## 2024-01-03 ENCOUNTER — Encounter: Payer: Self-pay | Admitting: Physician Assistant
